# Patient Record
Sex: Female | Born: 1955 | Race: White | Hispanic: No | Marital: Married | Smoking: Never smoker
Health system: Southern US, Community
[De-identification: ages and names within clinical notes are randomized; demographics above are authoritative.]

## PROBLEM LIST (undated history)

## (undated) DIAGNOSIS — F32A Depression, unspecified: Secondary | ICD-10-CM

## (undated) DIAGNOSIS — G4730 Sleep apnea, unspecified: Secondary | ICD-10-CM

## (undated) DIAGNOSIS — M1990 Unspecified osteoarthritis, unspecified site: Secondary | ICD-10-CM

## (undated) DIAGNOSIS — E785 Hyperlipidemia, unspecified: Secondary | ICD-10-CM

## (undated) DIAGNOSIS — F419 Anxiety disorder, unspecified: Secondary | ICD-10-CM

## (undated) HISTORY — DX: Anxiety disorder, unspecified: F41.9

## (undated) HISTORY — DX: Sleep apnea, unspecified: G47.30

## (undated) HISTORY — DX: Hyperlipidemia, unspecified: E78.5

## (undated) HISTORY — DX: Depression, unspecified: F32.A

## (undated) HISTORY — PX: TONSILLECTOMY: SUR1361

## (undated) HISTORY — PX: TUBAL LIGATION: SHX77

## (undated) HISTORY — PX: JOINT REPLACEMENT: SHX530

## (undated) HISTORY — DX: Unspecified osteoarthritis, unspecified site: M19.90

---

## 2012-12-24 HISTORY — PX: ROTATOR CUFF REPAIR: SHX139

## 2014-12-24 HISTORY — PX: OTHER SURGICAL HISTORY: SHX169

## 2018-09-12 ENCOUNTER — Other Ambulatory Visit: Payer: Self-pay | Admitting: Nurse Practitioner

## 2018-09-12 DIAGNOSIS — Z1231 Encounter for screening mammogram for malignant neoplasm of breast: Secondary | ICD-10-CM

## 2018-10-21 ENCOUNTER — Ambulatory Visit: Payer: Self-pay

## 2018-10-22 ENCOUNTER — Other Ambulatory Visit: Payer: Self-pay | Admitting: Nurse Practitioner

## 2018-10-22 DIAGNOSIS — Z13820 Encounter for screening for osteoporosis: Secondary | ICD-10-CM

## 2018-10-29 ENCOUNTER — Ambulatory Visit
Admission: RE | Admit: 2018-10-29 | Discharge: 2018-10-29 | Disposition: A | Discharge status: Home / Self Care | Payer: BC Managed Care – PPO | Source: Ambulatory Visit | Attending: Nurse Practitioner | Admitting: Nurse Practitioner

## 2018-10-29 DIAGNOSIS — Z13820 Encounter for screening for osteoporosis: Secondary | ICD-10-CM

## 2018-10-31 ENCOUNTER — Ambulatory Visit
Admission: RE | Admit: 2018-10-31 | Discharge: 2018-10-31 | Disposition: A | Discharge status: Home / Self Care | Payer: BC Managed Care – PPO | Source: Ambulatory Visit | Attending: Nurse Practitioner | Admitting: Nurse Practitioner

## 2018-10-31 DIAGNOSIS — Z1231 Encounter for screening mammogram for malignant neoplasm of breast: Secondary | ICD-10-CM

## 2018-12-03 ENCOUNTER — Other Ambulatory Visit: Payer: Self-pay | Admitting: Nurse Practitioner

## 2018-12-03 ENCOUNTER — Other Ambulatory Visit (HOSPITAL_COMMUNITY)
Admission: RE | Admit: 2018-12-03 | Discharge: 2018-12-03 | Disposition: A | Discharge status: Home / Self Care | Payer: BC Managed Care – PPO | Source: Ambulatory Visit | Attending: Nurse Practitioner | Admitting: Nurse Practitioner

## 2018-12-03 DIAGNOSIS — Z124 Encounter for screening for malignant neoplasm of cervix: Secondary | ICD-10-CM | POA: Insufficient documentation

## 2018-12-05 LAB — CYTOLOGY - PAP: Diagnosis: NEGATIVE

## 2019-11-03 ENCOUNTER — Other Ambulatory Visit: Payer: Self-pay | Admitting: Internal Medicine

## 2019-11-03 DIAGNOSIS — Z1231 Encounter for screening mammogram for malignant neoplasm of breast: Secondary | ICD-10-CM

## 2019-12-28 ENCOUNTER — Other Ambulatory Visit: Payer: Self-pay

## 2019-12-28 ENCOUNTER — Ambulatory Visit
Admission: RE | Admit: 2019-12-28 | Discharge: 2019-12-28 | Disposition: A | Discharge status: Home / Self Care | Payer: BC Managed Care – PPO | Source: Ambulatory Visit | Attending: Internal Medicine | Admitting: Internal Medicine

## 2019-12-28 DIAGNOSIS — Z1231 Encounter for screening mammogram for malignant neoplasm of breast: Secondary | ICD-10-CM

## 2020-09-08 ENCOUNTER — Other Ambulatory Visit: Payer: Self-pay | Admitting: Orthopedic Surgery

## 2020-09-08 DIAGNOSIS — M75121 Complete rotator cuff tear or rupture of right shoulder, not specified as traumatic: Secondary | ICD-10-CM

## 2020-09-08 DIAGNOSIS — M75112 Incomplete rotator cuff tear or rupture of left shoulder, not specified as traumatic: Secondary | ICD-10-CM

## 2020-09-29 ENCOUNTER — Ambulatory Visit
Admission: RE | Admit: 2020-09-29 | Discharge: 2020-09-29 | Disposition: A | Discharge status: Home / Self Care | Payer: BC Managed Care – PPO | Source: Ambulatory Visit | Attending: Orthopedic Surgery | Admitting: Orthopedic Surgery

## 2020-09-29 ENCOUNTER — Other Ambulatory Visit: Payer: Self-pay

## 2020-09-29 DIAGNOSIS — M75112 Incomplete rotator cuff tear or rupture of left shoulder, not specified as traumatic: Secondary | ICD-10-CM

## 2020-09-29 DIAGNOSIS — M75121 Complete rotator cuff tear or rupture of right shoulder, not specified as traumatic: Secondary | ICD-10-CM

## 2020-10-13 ENCOUNTER — Ambulatory Visit: Payer: BC Managed Care – PPO | Attending: Orthopedic Surgery | Admitting: Physical Therapy

## 2020-10-13 ENCOUNTER — Other Ambulatory Visit: Payer: Self-pay

## 2020-10-13 ENCOUNTER — Encounter: Payer: Self-pay | Admitting: Physical Therapy

## 2020-10-13 DIAGNOSIS — M25512 Pain in left shoulder: Secondary | ICD-10-CM | POA: Diagnosis present

## 2020-10-13 DIAGNOSIS — M25511 Pain in right shoulder: Secondary | ICD-10-CM

## 2020-10-13 DIAGNOSIS — G8929 Other chronic pain: Secondary | ICD-10-CM | POA: Insufficient documentation

## 2020-10-13 NOTE — Therapy (Signed)
[facility] Outpatient Rehabilitation [facility] [address] [place], [place], [address] Phone: [phone]   Fax:  [phone]  Physical Therapy Evaluation  Patient Details  Name: Stacey Espinoza MRN: [id number] Date of Birth: 1956/05/19 Referring Provider (PT): Julieanne Cotton, MD   Encounter Date: 10/13/2020   PT End of Session - 10/13/20 0925    Visit Number 1    Number of Visits 12    Date for PT Re-Evaluation 11/24/20    Authorization Type BCBS-recheck FOTO status at visits 6 and 10    PT Start Time 0924    PT Stop Time 1010    PT Time Calculation (min) 46 min    Activity Tolerance Patient tolerated treatment well    Behavior During Therapy [facility] for tasks assessed/performed           Past Medical History:  Diagnosis Date  . Anxiety   . Arthritis   . Depression   . Hyperlipidemia   . Sleep apnea     Past Surgical History:  Procedure Laterality Date  . CESAREAN SECTION    . JOINT REPLACEMENT     Bilateral total knee arthroplasty 2006  . ROTATOR CUFF REPAIR Right 2014  . sleeve gastrectomy  2016  . TONSILLECTOMY    . TUBAL LIGATION      There were no vitals filed for this visit.    Subjective Assessment - 10/13/20 0932    Subjective Pt. is a 64 y/o female referred to PT for c/o bilateral shoulder pain. She has previous history right shoulder rotator cuff repair with SAD, DCE in 2014 and had done well after but reports began having sharp pain in right shoulder in August of this year, no specific mechanism of injury noted. She has had ongoing chronic intermittent left shoulder pain as well. Pt. had MRI for both shoulders which revealed small partial thickness + intrasubstance rotator cuff tears, bursitis and tendinosis-see chart copy of report for details. Plan is to try conservative tx. first before considering surgical intervention.    Pertinent History surgical history for right shoulder, anxiety and depression    Limitations Lifting;House hold  activities    Diagnostic tests MRI, X-rays    Patient Stated Goals Knowing what to do and not to do with shoulders    Currently in Pain? Yes    Pain Score 2     Pain Location Shoulder    Pain Orientation Right;Left    Pain Descriptors / Indicators Dull    Pain Type Chronic pain;Acute pain   acute on right, chronic on left   Pain Onset More than a month ago    Pain Frequency Constant    Aggravating Factors  reaching, activity    Pain Relieving Factors ice, medication    Effect of Pain on Daily Activities impacts ability reaching ADLs/IADLs              [facility] PT Assessment - 10/13/20 0001      Assessment   Medical Diagnosis Bilateral shoulder bursitis, weakness, impingement    Referring Provider (PT) Julieanne Cotton, MD    Onset Date/Surgical Date 08/07/20   estimated time of onset right-sided pain   Hand Dominance Right    Prior Therapy did PT after shoulder sx. 2014      Precautions   Precautions None      Restrictions   Weight Bearing Restrictions No      Balance Screen   Has the patient fallen in the past 6 months No  Home Environment   Living Environment Private residence    Living Arrangements Spouse/significant other      Prior Function   Level of Independence Independent with basic ADLs      Cognition   Overall Cognitive Status Within Functional Limits for tasks assessed      Observation/Other Assessments   Focus on Therapeutic Outcomes (FOTO)  43% limited      Posture/Postural Control   Posture/Postural Control Postural limitations    Postural Limitations Rounded Shoulders      ROM / Strength   AROM / PROM / Strength AROM;PROM;Strength      AROM   AROM Assessment Site Shoulder;Elbow    Right/Left Shoulder Right;Left    Right Shoulder Flexion 150 Degrees    Right Shoulder ABduction 130 Degrees    Right Shoulder Internal Rotation 60 Degrees   reach to T12   Right Shoulder External Rotation 60 Degrees   reach to T2   Left Shoulder Flexion 170  Degrees    Left Shoulder ABduction 160 Degrees    Left Shoulder Internal Rotation 50 Degrees   reach to T7   Left Shoulder External Rotation 70 Degrees   reahc to T3   Right/Left Elbow --   bilat. elbow AROM WFL     PROM   PROM Assessment Site Shoulder    Right/Left Shoulder Right;Left    Right Shoulder Internal Rotation 60 Degrees    Right Shoulder External Rotation 60 Degrees    Left Shoulder Internal Rotation 50 Degrees    Left Shoulder External Rotation 70 Degrees      Strength   Strength Assessment Site Shoulder;Elbow    Right/Left Shoulder Right;Left    Right Shoulder Flexion 5/5    Right Shoulder ABduction 4+/5    Right Shoulder Internal Rotation 5/5    Right Shoulder External Rotation 4+/5    Left Shoulder Flexion 5/5    Left Shoulder ABduction 5/5    Left Shoulder Internal Rotation 5/5    Left Shoulder External Rotation 5/5    Right/Left Elbow Right;Left    Right Elbow Flexion 5/5    Right Elbow Extension 5/5    Left Elbow Flexion 5/5    Left Elbow Extension 5/5      Palpation   Palpation comment tender to palpation with trigger points bilat. infraspinatus, bilat. supraspinatus tenderness to palpation as well      Special Tests   Other special tests (+) Empty can on right, (+) painful arc on right (both (-) on left, [place] (-) bilat.                      Objective measurements completed on examination: See above findings.       [facility] Adult PT Treatment/Exercise - 10/13/20 0001      Exercises   Exercises --   HEP handout review/instruction                 PT Education - 10/13/20 1050    Education Details eval findings, symptom etiology with shoulder anatomy, HEP, POC, FOTO patient report    Person(s) Educated Patient    Methods Explanation;Demonstration;Verbal cues;Handout    Comprehension Returned demonstration;Verbalized understanding               PT Long Term Goals - 10/13/20 1200      PT LONG TERM GOAL #1   Title  Independent with HEP    Baseline needs HEP    Time 6  Period Weeks    Status New    Target Date 11/24/20      PT LONG TERM GOAL #2   Title Improve FOTO outcome measure score to 33% or less limitation due to shoulder pain    Baseline 43% limited    Time 6    Period Weeks    Status New    Target Date 11/24/20      PT LONG TERM GOAL #3   Title Increase right shoulder IR reach to T9 or greater to improve ability to get dressed/donn bra    Baseline reach to T12    Time 6    Period Weeks    Status New    Target Date 11/24/20      PT LONG TERM GOAL #4   Title Increase right shoulder strength for abduction and ER to improve ability for lifting for chores    Baseline 4+/5    Time 6    Period Weeks    Status New    Target Date 11/24/20      PT LONG TERM GOAL #5   Title Perform reaching ADLs with bilateral shoulder pain decreased at least 50% or greater from baseline status    Time 6    Period Weeks    Status New    Target Date 11/24/20                  Plan - 10/13/20 1050    Clinical Impression Statement Pt. presents with bilateral shoulder pain with right>left sided weakness, painful abduction motion consistent with underlying impingement with contributing underying acromial morphology on left (history acromioplasty on right). Pt. would benefit from PT to help address bilateral shoulder pain and associated functional limitations for reaching and lifting activities.    Personal Factors and Comorbidities Comorbidity 2;Time since onset of injury/illness/exacerbation    Comorbidities see PMH, time since onset for left sided symptoms as well as surgical history on right    Examination-Activity Limitations Lift;Bathing;Dressing;Carry;Reach Overhead    Examination-Participation Restrictions Cleaning    Stability/Clinical Decision Making Evolving/Moderate complexity    Clinical Decision Making Moderate    Rehab Potential Good    PT Frequency 2x / week    PT Duration 6  weeks    PT Treatment/Interventions ADLs/Self Care Home Management;Cryotherapy;Electrical Stimulation;Iontophoresis 4mg /ml Dexamethasone;Therapeutic exercise;Patient/family education;Ultrasound;Manual techniques;Dry needling;Passive range of motion;Taping;Therapeutic activities;Neuromuscular re-education;Moist Heat    PT Next Visit Plan Review HEP as needed, focus rotator cuff and periscapular strengthening, shoulder strengthening in pain free ranges, prn manual, if needed potential consideration dry needling but plan exercise focus first    PT Home Exercise Plan Access code: [email or web address]    Consulted and Agree with Plan of Care Patient           Patient will benefit from skilled therapeutic intervention in order to improve the following deficits and impairments:  Pain, Postural dysfunction, Impaired UE functional use, Decreased strength, Decreased range of motion  Visit Diagnosis: Acute pain of right shoulder  Chronic left shoulder pain     Problem List There are no problems to display for this patient.   , PT, DPT 10/13/20 12:11 PM  [facility] Out[facility] Rehabilitation [facility] [address] [place], [place], [place] Phone: [phone]   Fax:  [phone]  Name: Stacey Espinoza MRN: Shari Heritage Date of Birth: 1956/12/11

## 2020-10-20 ENCOUNTER — Other Ambulatory Visit: Payer: Self-pay

## 2020-10-20 ENCOUNTER — Ambulatory Visit: Payer: BC Managed Care – PPO | Admitting: Physical Therapy

## 2020-10-20 ENCOUNTER — Encounter: Payer: Self-pay | Admitting: Physical Therapy

## 2020-10-20 DIAGNOSIS — M25511 Pain in right shoulder: Secondary | ICD-10-CM | POA: Diagnosis not present

## 2020-10-20 DIAGNOSIS — G8929 Other chronic pain: Secondary | ICD-10-CM

## 2020-10-20 NOTE — Therapy (Signed)
[facility] Outpatient Rehabilitation [facility] [address] [place], [place], [address] Phone: [phone]   Fax:  [phone]  Physical Therapy Treatment  Patient Details  Name: Stacey Espinoza MRN: [id number] Date of Birth: Apr 19, 1956 Referring Provider (PT): Julieanne Cotton, MD   Encounter Date: 10/20/2020   PT End of Session - 10/20/20 0944    Visit Number 2    Number of Visits 12    Date for PT Re-Evaluation 11/24/20    Authorization Type BCBS-recheck FOTO status at visits 6 and 10    PT Start Time 0930    PT Stop Time 1020    PT Time Calculation (min) 50 min           Past Medical History:  Diagnosis Date  . Anxiety   . Arthritis   . Depression   . Hyperlipidemia   . Sleep apnea     Past Surgical History:  Procedure Laterality Date  . CESAREAN SECTION    . JOINT REPLACEMENT     Bilateral total knee arthroplasty 2006  . ROTATOR CUFF REPAIR Right 2014  . sleeve gastrectomy  2016  . TONSILLECTOMY    . TUBAL LIGATION      There were no vitals filed for this visit.   Subjective Assessment - 10/20/20 0934    Subjective I have done the exercises. I have random severe pains. I had pain wtih just putting my pills in my mouth.    Currently in Pain? Yes    Pain Score 2     Pain Location Shoulder    Pain Orientation Right;Left    Pain Descriptors / Indicators Dull    Pain Type --   acute on chronic   Aggravating Factors  reaching, activity    Pain Relieving Factors ice meds, rolling onto shoulder that hurts helps                             [facility] Adult PT Treatment/Exercise - 10/20/20 0001      Shoulder Exercises: Supine   Protraction Limitations 30 reps, red     Horizontal ABduction Limitations 30 reps, red       Shoulder Exercises: Sidelying   External Rotation Weight (lbs) 1    External Rotation Limitations 15 reps x 2 each       Shoulder Exercises: Standing   External Rotation Limitations 30 reps , red bilat      Internal Rotation Limitations 30 reps red , RT, LT     Extension Limitations 30 reps , green     Row Limitations 30 reps , green     Other Standing Exercises seated cross body stretch 2 x 30 sec                   PT Education - 10/20/20 1014    Education Details HEP    Person(s) Educated Patient    Methods Explanation;Handout    Comprehension Verbalized understanding               PT Long Term Goals - 10/13/20 1200      PT LONG TERM GOAL #1   Title Independent with HEP    Baseline needs HEP    Time 6    Period Weeks    Status New    Target Date 11/24/20      PT LONG TERM GOAL #2   Title Improve FOTO outcome measure score to 33% or less  limitation due to shoulder pain    Baseline 43% limited    Time 6    Period Weeks    Status New    Target Date 11/24/20      PT LONG TERM GOAL #3   Title Increase right shoulder IR reach to T9 or greater to improve ability to get dressed/donn bra    Baseline reach to T12    Time 6    Period Weeks    Status New    Target Date 11/24/20      PT LONG TERM GOAL #4   Title Increase right shoulder strength for abduction and ER to improve ability for lifting for chores    Baseline 4+/5    Time 6    Period Weeks    Status New    Target Date 11/24/20      PT LONG TERM GOAL #5   Title Perform reaching ADLs with bilateral shoulder pain decreased at least 50% or greater from baseline status    Time 6    Period Weeks    Status New    Target Date 11/24/20                 Plan - 10/20/20 1035    Clinical Impression Statement Pt reports compliance with HEP. She reports fear with most daily activites and thinks she is further damaging her shoulders. Reviewed HEP and progressed with  sidelying ER strength and cross body stretching. Updated HEP. Pt reports fatigue with therex but no pain. Trail of ice packs at end of session to bilateral shoulders. Pt is interested in purchasing larger ice packs for home use.    PT Next Visit  Plan Review HEP as needed, focus rotator cuff and periscapular strengthening, shoulder strengthening in pain free ranges, prn manual, if needed potential consideration dry needling but plan exercise focus first    PT Home Exercise Plan Access code: [email or web address] added side ER 1# and crossbody stretch           Patient will benefit from skilled therapeutic intervention in order to improve the following deficits and impairments:     Visit Diagnosis: Acute pain of right shoulder  Chronic left shoulder pain     Problem List There are no problems to display for this patient.   Sherrie Mustache, [place] 10/20/2020, 10:39 AM  [facility] [address] [place], [place], [address] Phone: [phone]   Fax:  [phone]  Name: Stacey Espinoza MRN: [id number] Date of Birth: 1956-03-07

## 2020-10-20 NOTE — Patient Instructions (Signed)
Access Code: [email or web address] URL: [email or web address] Date: 10/20/2020 Prepared by: Jannette Spanner  Exercises added  Sidelying Shoulder ER with Towel and Dumbbell - 1 x daily - 7 x weekly - 3 sets - 10 reps Standing Shoulder Posterior Capsule Stretch - 1 x daily - 7 x weekly - 1 sets - 2-3 reps - 20-30 hold

## 2020-10-25 ENCOUNTER — Ambulatory Visit: Payer: BC Managed Care – PPO | Attending: Orthopedic Surgery | Admitting: Physical Therapy

## 2020-10-25 ENCOUNTER — Other Ambulatory Visit: Payer: Self-pay

## 2020-10-25 ENCOUNTER — Encounter: Payer: Self-pay | Admitting: Physical Therapy

## 2020-10-25 DIAGNOSIS — M25511 Pain in right shoulder: Secondary | ICD-10-CM | POA: Diagnosis present

## 2020-10-25 DIAGNOSIS — M25512 Pain in left shoulder: Secondary | ICD-10-CM | POA: Diagnosis present

## 2020-10-25 DIAGNOSIS — G8929 Other chronic pain: Secondary | ICD-10-CM | POA: Diagnosis present

## 2020-10-25 NOTE — Therapy (Signed)
[facility] Outpatient Rehabilitation [facility] [address] [place], [place], [address] Phone: [phone]   Fax:  [phone]  Physical Therapy Treatment  Patient Details  Name: Stacey Espinoza MRN: [id number] Date of Birth: 07-Jun-1956 Referring Provider (PT): Julieanne Cotton, MD   Encounter Date: 10/25/2020   PT End of Session - 10/25/20 1045    Visit Number 3    Number of Visits 12    Date for PT Re-Evaluation 11/24/20    Authorization Type BCBS-recheck FOTO status at visits 6 and 10    PT Start Time 1015    PT Stop Time 1056    PT Time Calculation (min) 41 min    Activity Tolerance Patient tolerated treatment well    Behavior During Therapy [facility] for tasks assessed/performed           Past Medical History:  Diagnosis Date  . Anxiety   . Arthritis   . Depression   . Hyperlipidemia   . Sleep apnea     Past Surgical History:  Procedure Laterality Date  . CESAREAN SECTION    . JOINT REPLACEMENT     Bilateral total knee arthroplasty 2006  . ROTATOR CUFF REPAIR Right 2014  . sleeve gastrectomy  2016  . TONSILLECTOMY    . TUBAL LIGATION      There were no vitals filed for this visit.   Subjective Assessment - 10/25/20 1035    Subjective Continued with pain last week with associated frustration with continued symptoms but doing a little better this week. Modifying activity to minimize exacerbating motions and continues with good HEP compliance.    Pertinent History surgical history for right shoulder, anxiety and depression    Currently in Pain? Yes    Pain Score --   1-2/10   Pain Location Shoulder    Pain Orientation Right;Left    Pain Descriptors / Indicators Dull    Pain Type --   acute on chronic   Pain Onset More than a month ago    Pain Frequency Constant    Aggravating Factors  reaching, activity    Pain Relieving Factors ice, medication    Effect of Pain on Daily Activities impacts ability for reaching ADLs                              [facility] Adult PT Treatment/Exercise - 10/25/20 0001      Shoulder Exercises: Supine   Protraction AROM;Strengthening;Both;20 reps    Protraction Weight (lbs) serratus punch 2 lbs. ea. UE bilat. 2x10    Other Supine Exercises supien rhythmic stabilization at 90 deg flexion 3 x20 sec ea. bilat.      Shoulder Exercises: Sidelying   External Rotation Weight (lbs) 1    External Rotation Limitations 2x10 ea. bilat. eccentric emphasis    ABduction Limitations scaotion x 15 reps each bilat.      Shoulder Exercises: Standing   Protraction Limitations wall "push up with a plus" 2x10    External Rotation Limitations ER isometric 5 sec x 10 ea. bilat-demo as alternative to Theraband exercise for HEP if too sore at times to perform    Internal Rotation AROM;Strengthening;Both;20 reps    Theraband Level (Shoulder Internal Rotation) Level 3 (Green)    Row AROM;Strengthening;Both;20 reps    Theraband Level (Shoulder Row) Level 4 (Blue)    Other Standing Exercises small ball "circles" at wall CW/CCW x 15 ea. way bilat.  Manual Therapy   Manual Therapy Soft tissue mobilization    Soft tissue mobilization STM/trigger point release bilat. posterior rotator cuff/periscaoular region                  PT Education - 10/25/20 1057    Education Details plane of motion for reaching into scaption rather than abduction    Person(s) Educated Patient    Methods Explanation;Demonstration    Comprehension Verbalized understanding               PT Long Term Goals - 10/13/20 1200      PT LONG TERM GOAL #1   Title Independent with HEP    Baseline needs HEP    Time 6    Period Weeks    Status New    Target Date 11/24/20      PT LONG TERM GOAL #2   Title Improve FOTO outcome measure score to 33% or less limitation due to shoulder pain    Baseline 43% limited    Time 6    Period Weeks    Status New    Target Date 11/24/20      PT LONG TERM GOAL #3   Title Increase right  shoulder IR reach to T9 or greater to improve ability to get dressed/donn bra    Baseline reach to T12    Time 6    Period Weeks    Status New    Target Date 11/24/20      PT LONG TERM GOAL #4   Title Increase right shoulder strength for abduction and ER to improve ability for lifting for chores    Baseline 4+/5    Time 6    Period Weeks    Status New    Target Date 11/24/20      PT LONG TERM GOAL #5   Title Perform reaching ADLs with bilateral shoulder pain decreased at least 50% or greater from baseline status    Time 6    Period Weeks    Status New    Target Date 11/24/20                 Plan - 10/25/20 1047    Clinical Impression Statement Mild improvement noted now with tx. to date  with tx. and HEP to date. Given symptom etiology gradual progress expected so would consider as good progress/tx. response to date. Plan continue PT for further progress to help decrease pain and address associated functional limitations for reaching ADLs.    Personal Factors and Comorbidities Comorbidity 2;Time since onset of injury/illness/exacerbation    Examination-Activity Limitations Lift;Bathing;Dressing;Carry;Reach Overhead    Examination-Participation Restrictions Cleaning    Stability/Clinical Decision Making Evolving/Moderate complexity    Clinical Decision Making Moderate    Rehab Potential Good    PT Frequency 2x / week    PT Duration 6 weeks    PT Treatment/Interventions ADLs/Self Care Home Management;Cryotherapy;Electrical Stimulation;Iontophoresis 4mg /ml Dexamethasone;Therapeutic exercise;Patient/family education;Ultrasound;Manual techniques;Dry needling;Passive range of motion;Taping;Therapeutic activities;Neuromuscular re-education;Moist Heat    PT Next Visit Plan focus rotator cuff and periscapular strengthening, shoulder strengthening in pain free ranges, prn manual, if needed potential consideration dry needling but plan exercise focus first    PT Home Exercise Plan  Access code: [email or web address] added side ER 1# and crossbody stretch    Consulted and Agree with Plan of Care Patient           Patient will benefit from skilled therapeutic intervention in order to improve the following deficits  and impairments:  Pain, Postural dysfunction, Impaired UE functional use, Decreased strength, Decreased range of motion  Visit Diagnosis: Acute pain of right shoulder  Chronic left shoulder pain     Problem List There are no problems to display for this patient.   Lazarus Gowda, PT, DPT 10/25/20 10:59 AM  [facility] [address] [place], [place], [address] Phone: [phone]   Fax:  [phone]  Name: Stacey Espinoza MRN: [id number] Date of Birth: 04/08/1956

## 2020-10-27 ENCOUNTER — Ambulatory Visit: Payer: BC Managed Care – PPO | Admitting: Physical Therapy

## 2020-11-01 ENCOUNTER — Ambulatory Visit: Payer: BC Managed Care – PPO | Admitting: Physical Therapy

## 2020-11-01 ENCOUNTER — Other Ambulatory Visit: Payer: Self-pay

## 2020-11-01 ENCOUNTER — Encounter: Payer: Self-pay | Admitting: Physical Therapy

## 2020-11-01 DIAGNOSIS — M25512 Pain in left shoulder: Secondary | ICD-10-CM

## 2020-11-01 DIAGNOSIS — M25511 Pain in right shoulder: Secondary | ICD-10-CM | POA: Diagnosis not present

## 2020-11-01 DIAGNOSIS — G8929 Other chronic pain: Secondary | ICD-10-CM

## 2020-11-01 NOTE — Patient Instructions (Signed)

## 2020-11-01 NOTE — Therapy (Signed)
[facility] [address] [place], [place], [address] Phone: [phone]   Fax:  [phone]  Physical Therapy Evaluation  Patient Details  Name: Stacey Espinoza MRN: [id number] Date of Birth: 12/15/1956 Referring Provider (PT): Julieanne Cotton, MD   Encounter Date: 11/01/2020   PT End of Session - 11/01/20 0927    Visit Number 4    Number of Visits 12    Date for PT Re-Evaluation 11/24/20    Authorization Type BCBS-recheck FOTO status at visits 6 and 10    PT Start Time 0924    PT Stop Time 1012    PT Time Calculation (min) 48 min    Activity Tolerance Patient tolerated treatment well    Behavior During Therapy [facility] for tasks assessed/performed           Past Medical History:  Diagnosis Date  . Anxiety   . Arthritis   . Depression   . Hyperlipidemia   . Sleep apnea     Past Surgical History:  Procedure Laterality Date  . CESAREAN SECTION    . JOINT REPLACEMENT     Bilateral total knee arthroplasty 2006  . ROTATOR CUFF REPAIR Right 2014  . sleeve gastrectomy  2016  . TONSILLECTOMY    . TUBAL LIGATION      There were no vitals filed for this visit.    Subjective Assessment - 11/01/20 0952    Subjective Improvement with shoulder in terms of GH joint pain-no pain on left and mild symptoms on right but today primary complaint today is right upper trapezius region pain with onset about 2 days ago-no mechanism of injury noted and difficulty noted with turning head to right.    Currently in Pain? Yes    Pain Score 3     Pain Location Neck    Pain Orientation Right    Pain Descriptors / Indicators Burning    Pain Type Acute pain    Pain Radiating Towards right upper trapezius region    Pain Onset In the past 7 days    Pain Frequency Constant    Aggravating Factors  turning head to the side    Effect of Pain on Daily Activities increased difficulty driving                          Objective  measurements completed on examination: See above findings.       [facility] Adult PT Treatment/Exercise - 11/01/20 0001      Exercises   Exercises Neck      Shoulder Exercises: Standing   External Rotation AROM;Strengthening;Right;Left;20 reps    External Rotation Limitations red band righ side, green band left side    Internal Rotation AROM;Strengthening;Both;20 reps    Internal Rotation Limitations green band right side, blue band left side    Flexion AROM;Strengthening;Both;20 reps    Shoulder Flexion Weight (lbs) 1    ABduction AROM;Strengthening;Right;Left;20 reps    ABduction Limitations scaption 2x10 ea. bilat. with 1 lb. weight on left, no weight on right    Extension Limitations Freemotion shoulder extension 3 lbs. ea. bilat. 2x10    Row Limitations Freemotion cable row 3 lbs. 2x10 ea. bilat.    Other Standing Exercises body blade ER/IR 20 sec x 3 each bilat.      Manual Therapy   Manual Therapy Manual Traction    Soft tissue mobilization right upper trapezius regiobn and levator trigger point STM    Manual  Traction gentle cervical manual traction      Neck Exercises: Stretches   Upper Trapezius Stretch Right;3 reps;30 seconds    Levator Stretch Right;3 reps;30 seconds            Trigger Point Dry Needling - 11/01/20 0001    Consent Given? Yes    Education Handout Provided Yes    Muscles Treated Head and Neck Upper trapezius    Dry Needling Comments right upper trapezius in left sidelying with 30-32 gauge 30 mm needles                PT Education - 11/01/20 1009    Education Details dry needling, trigger point etiology, HEP updates    Person(s) Educated Patient    Methods Explanation;Demonstration;Handout    Comprehension Verbalized understanding;Returned demonstration               PT Long Term Goals - 10/13/20 1200      PT LONG TERM GOAL #1   Title Independent with HEP    Baseline needs HEP    Time 6    Period Weeks    Status New    Target  Date 11/24/20      PT LONG TERM GOAL #2   Title Improve FOTO outcome measure score to 33% or less limitation due to shoulder pain    Baseline 43% limited    Time 6    Period Weeks    Status New    Target Date 11/24/20      PT LONG TERM GOAL #3   Title Increase right shoulder IR reach to T9 or greater to improve ability to get dressed/donn bra    Baseline reach to T12    Time 6    Period Weeks    Status New    Target Date 11/24/20      PT LONG TERM GOAL #4   Title Increase right shoulder strength for abduction and ER to improve ability for lifting for chores    Baseline 4+/5    Time 6    Period Weeks    Status New    Target Date 11/24/20      PT LONG TERM GOAL #5   Title Perform reaching ADLs with bilateral shoulder pain decreased at least 50% or greater from baseline status    Time 6    Period Weeks    Status New    Target Date 11/24/20                  Plan - 11/01/20 1010    Clinical Impression Statement Included trial dry needling today to address right upper trapezius region pain consistent with myofascial etiology-would suspect associated with compensatory use of upper trapezius for right shoulder ROM vs. postural association. Otherwise progressing well from baseline status with decreased shoulder pain.    Personal Factors and Comorbidities Comorbidity 2;Time since onset of injury/illness/exacerbation    Comorbidities see PMH, time since onset for left sided symptoms as well as surgical history on right    Examination-Activity Limitations Lift;Bathing;Dressing;Carry;Reach Overhead    Examination-Participation Restrictions Cleaning    Stability/Clinical Decision Making Evolving/Moderate complexity    Clinical Decision Making Moderate    Rehab Potential Good    PT Frequency 2x / week    PT Duration 6 weeks    PT Treatment/Interventions ADLs/Self Care Home Management;Cryotherapy;Electrical Stimulation;Iontophoresis 4mg /ml Dexamethasone;Therapeutic  exercise;Patient/family education;Ultrasound;Manual techniques;Dry needling;Passive range of motion;Taping;Therapeutic activities;Neuromuscular re-education;Moist Heat    PT Next Visit Plan Check response  dry needling and continue as found beneficial, continue focus rotator cuff and periscapular strengthening, shoulder strengthening in pain free ranges, prn manual, if needed potential consideration dry needling but plan exercise focus first    PT Home Exercise Plan Access code: [email or web address] added side ER 1# and crossbody stretch    Consulted and Agree with Plan of Care Patient           Patient will benefit from skilled therapeutic intervention in order to improve the following deficits and impairments:  Pain, Postural dysfunction, Impaired UE functional use, Decreased strength, Decreased range of motion  Visit Diagnosis: Acute pain of right shoulder  Chronic left shoulder pain     Problem List There are no problems to display for this patient.  Lazarus Gowda, PT, DPT 11/01/20 10:29 AM  [facility] [address] [place], [place], [address] Phone: [phone]   Fax:  [phone]  Name: Stacey Espinoza MRN: [id number] Date of Birth: Jan 11, 1956

## 2020-11-03 ENCOUNTER — Encounter: Payer: BC Managed Care – PPO | Admitting: Physical Therapy

## 2020-11-08 ENCOUNTER — Other Ambulatory Visit: Payer: Self-pay

## 2020-11-08 ENCOUNTER — Ambulatory Visit: Payer: BC Managed Care – PPO | Admitting: Physical Therapy

## 2020-11-08 ENCOUNTER — Encounter: Payer: Self-pay | Admitting: Physical Therapy

## 2020-11-08 DIAGNOSIS — G8929 Other chronic pain: Secondary | ICD-10-CM

## 2020-11-08 DIAGNOSIS — M25511 Pain in right shoulder: Secondary | ICD-10-CM | POA: Diagnosis not present

## 2020-11-08 NOTE — Therapy (Signed)
[facility] [address] [place], [place], [address] Phone: [phone]   Fax:  [phone]  Physical Therapy Treatment  Patient Details  Name: Stacey Espinoza MRN: [id number] Date of Birth: 11/05/1956 Referring Provider (PT): Julieanne Cotton, MD   Encounter Date: 11/08/2020   PT End of Session - 11/08/20 0937    Visit Number 5    Number of Visits 12    Date for PT Re-Evaluation 11/24/20    Authorization Type BCBS-recheck FOTO status at visits 6 and 10    PT Start Time 0924    PT Stop Time 1010    PT Time Calculation (min) 46 min    Activity Tolerance Patient tolerated treatment well    Behavior During Therapy [facility] for tasks assessed/performed           Past Medical History:  Diagnosis Date  . Anxiety   . Arthritis   . Depression   . Hyperlipidemia   . Sleep apnea     Past Surgical History:  Procedure Laterality Date  . CESAREAN SECTION    . JOINT REPLACEMENT     Bilateral total knee arthroplasty 2006  . ROTATOR CUFF REPAIR Right 2014  . sleeve gastrectomy  2016  . TONSILLECTOMY    . TUBAL LIGATION      There were no vitals filed for this visit.   Subjective Assessment - 11/08/20 0927    Subjective Pt. reports initially no benefit from dry needling the day after session but has since noted resolution of upper trapezius region pain. Left shoulder doing well. Right shoulder feels "tired" but pain has been minimal and reports sleeping better now.    Currently in Pain? Yes    Pain Score 1     Pain Location Shoulder    Pain Orientation Right    Pain Descriptors / Indicators --   "tired"   Pain Type Acute pain    Pain Onset More than a month ago    Pain Frequency Intermittent    Aggravating Factors  reaching activities    Pain Relieving Factors ice, medication                             [facility] Adult PT Treatment/Exercise - 11/08/20 0001      Exercises   Exercises Shoulder      Shoulder  Exercises: Standing   Extension Limitations Freemotion shoulder extension 3 lbs. ea. bilat. 2x10    Row Limitations Freemotion cable row 3 lbs. 2x10 ea. bilat.    Diagonals Limitations D1 2x10 ea. bilat. with 2 lbs., D2 2x10 ea. bilat. no weight on right    Other Standing Exercises "W" ER row red band 2x10, wall "push up wth a plus" 2x10, small ball "alphabet" at wall x 1 ea. bilat.    Other Standing Exercises body blade ER 30 sec x 2 ea. bilat.      Shoulder Exercises: ROM/Strengthening   Nustep L5 x 5 min UE/LE      Manual Therapy   Manual Traction STM right upper trapezius and posterior scapular region                  PT Education - 11/08/20 1011    Education Details HEP, POC    Person(s) Educated Patient    Methods Explanation    Comprehension Verbalized understanding               PT Long Term  Goals - 10/13/20 1200      PT LONG TERM GOAL #1   Title Independent with HEP    Baseline needs HEP    Time 6    Period Weeks    Status New    Target Date 11/24/20      PT LONG TERM GOAL #2   Title Improve FOTO outcome measure score to 33% or less limitation due to shoulder pain    Baseline 43% limited    Time 6    Period Weeks    Status New    Target Date 11/24/20      PT LONG TERM GOAL #3   Title Increase right shoulder IR reach to T9 or greater to improve ability to get dressed/donn bra    Baseline reach to T12    Time 6    Period Weeks    Status New    Target Date 11/24/20      PT LONG TERM GOAL #4   Title Increase right shoulder strength for abduction and ER to improve ability for lifting for chores    Baseline 4+/5    Time 6    Period Weeks    Status New    Target Date 11/24/20      PT LONG TERM GOAL #5   Title Perform reaching ADLs with bilateral shoulder pain decreased at least 50% or greater from baseline status    Time 6    Period Weeks    Status New    Target Date 11/24/20                 Plan - 11/08/20 3704    Clinical  Impression Statement Good response to dry needling last week to ease right upper trapezius pain symptoms and good progress overall with improving bilat. shoulder pain symptoms and functional gains for reaching ADLs and decreased sleep disturbance. Focus exercise progression today but still some tissue tension in right upper trapezius and posterior scapular region so included STM to address. Given improvenment and in discussing status with pt. plan decrease freqeuncy to 1x/week and continue for 2 more weeks and potential d/c by then if improvement continues.    Personal Factors and Comorbidities Comorbidity 2;Time since onset of injury/illness/exacerbation    Comorbidities see PMH, time since onset for left sided symptoms as well as surgical history on right    Examination-Activity Limitations Lift;Bathing;Dressing;Carry;Reach Overhead    Examination-Participation Restrictions Cleaning    Stability/Clinical Decision Making Evolving/Moderate complexity    Clinical Decision Making Moderate    Rehab Potential Good    PT Frequency --   1-2x/week   PT Duration 6 weeks    PT Treatment/Interventions ADLs/Self Care Home Management;Cryotherapy;Electrical Stimulation;Iontophoresis 4mg /ml Dexamethasone;Therapeutic exercise;Patient/family education;Ultrasound;Manual techniques;Dry needling;Passive range of motion;Taping;Therapeutic activities;Neuromuscular re-education;Moist Heat    PT Next Visit Plan Recheck FOTO next session, Decrease to 1x/week and continue x 2 more weeks, focus exercise progression for shoulder strengthening, further dry needling/manual if needed    PT Home Exercise Plan Access code: [email or web address] added side ER 1# and crossbody stretch    Consulted and Agree with Plan of Care Patient           Patient will benefit from skilled therapeutic intervention in order to improve the following deficits and impairments:  Pain, Postural dysfunction, Impaired UE functional use, Decreased strength,  Decreased range of motion  Visit Diagnosis: Acute pain of right shoulder  Chronic left shoulder pain     Problem List There are no problems to  display for this patient.   Lazarus Gowda, PT, DPT 11/08/20 10:13 AM  [facility] [address] [place], [place], [address] Phone: [phone]   Fax:  [phone]  Name: Sadia Belfiore MRN: [id number] Date of Birth: Jan 26, 1956

## 2020-11-10 ENCOUNTER — Encounter: Payer: BC Managed Care – PPO | Admitting: Physical Therapy

## 2020-11-15 ENCOUNTER — Ambulatory Visit: Payer: BC Managed Care – PPO | Admitting: Physical Therapy

## 2020-11-15 ENCOUNTER — Other Ambulatory Visit: Payer: Self-pay

## 2020-11-15 ENCOUNTER — Encounter: Payer: Self-pay | Admitting: Physical Therapy

## 2020-11-15 DIAGNOSIS — G8929 Other chronic pain: Secondary | ICD-10-CM

## 2020-11-15 DIAGNOSIS — M25511 Pain in right shoulder: Secondary | ICD-10-CM

## 2020-11-15 NOTE — Therapy (Signed)
[place], [place], [address] Phone: [phone]   Fax:  [phone]  Physical Therapy Treatment  Patient Details  Name: Stacey Espinoza MRN: [id number] Date of Birth: 03-29-1956 Referring Provider (PT): Lita Mains, MD   Encounter Date: 11/15/2020   PT End of Session - 11/15/20 0925    Visit Number 6    Number of Visits 12    Date for PT Re-Evaluation 11/24/20    Authorization Type BCBS-recheck FOTO status at visits 6 and 10    PT Start Time 0928    PT Stop Time 1015   6 min spent dry needling not included in direct timed minutes   PT Time Calculation (min) 47 min    Activity Tolerance --   brief pain with dry needling and attempted horizontal abduction exercise otherwise session well-tolerated   Behavior During Therapy [facility] for tasks assessed/performed           Past Medical History:  Diagnosis Date  . Anxiety   . Arthritis   . Depression   . Hyperlipidemia   . Sleep apnea     Past Surgical History:  Procedure Laterality Date  . CESAREAN SECTION    . JOINT REPLACEMENT     Bilateral total knee arthroplasty 2006  . ROTATOR CUFF REPAIR Right 2014  . sleeve gastrectomy  2016  . TONSILLECTOMY    . TUBAL LIGATION      There were no vitals filed for this visit.   Subjective Assessment - 11/15/20 0933    Subjective Pt. reports symptoms were exacerbated a couple of days after last session with increased pain reaching and waking up a couple times with shoulder pain. Pain intermittently sharp and rated 3/10 this AM in right shoulder and 1/10 left shoulder. No further symptoms in right upper trapezius region.              [facility] PT Assessment - 11/15/20 0001      Observation/Other Assessments   Focus on Therapeutic Outcomes (FOTO)  40% limited                         [facility] Adult PT Treatment/Exercise - 11/15/20 0001      Shoulder Exercises: Supine   Horizontal ABduction --    attmpted with red band but held due to right shoulder pain   Theraband Level (Shoulder Horizontal ABduction) --    External Rotation AROM;Strengthening;Both;20 reps    Theraband Level (Shoulder External Rotation) Level 2 (Red)    Internal Rotation AROM;Strengthening;Right;20 reps      Shoulder Exercises: Sidelying   External Rotation AROM;Strengthening;Right;20 reps    External Rotation Weight (lbs) 1    ABduction Limitations scaption 1 lb. 2x10 right side      Shoulder Exercises: Standing   Protraction AROM;Strengthening;Both;20 reps    Protraction Limitations closed chain protraction at wall    Extension Limitations Freemotion shoulder extension 3 lbs. ea. bilat. 2x10    Row Limitations Freemotion cable row 3 lbs. 2x10 ea. bilat.    Other Standing Exercises bicep curl 3 lbs. 2x10 ea. bilat.    Other Standing Exercises body blad ER/IR at 0 deg abd 20 sec x 3 ea. bilat.      Shoulder Exercises: ROM/Strengthening   Nustep L4 x 5 min UE/LE      Manual Therapy   Manual Therapy Joint mobilization    Joint Mobilization Right shoulder AP GH mobs grade I-II  Soft tissue mobilization right posterior shoulder and anterior deltoid, proximal bicep region                  PT Education - 11/15/20 1017    Education Details symptom etiology, POC, HEP-perform exercises 3x/week and avoid any significant pain/hold any specific exercises that are too painful    Person(s) Educated Patient    Methods Explanation    Comprehension Verbalized understanding               PT Long Term Goals - 11/15/20 1027      PT LONG TERM GOAL #1   Title Independent with HEP    Baseline met for initial HEP, updates ongoing    Time 6    Period Weeks    Status Achieved      PT LONG TERM GOAL #2   Title Improve FOTO outcome measure score to 33% or less limitation due to shoulder pain    Baseline 40% limited 11/15/20    Time 6    Period Weeks    Status On-going      PT LONG TERM GOAL #3    Title Increase right shoulder IR reach to T9 or greater to improve ability to get dressed/donn bra    Baseline reach to T12    Time 6    Period Weeks    Status On-going      PT LONG TERM GOAL #4   Title Increase right shoulder strength for abduction and ER to 5/5 to improve ability for lifting for chores    Baseline 4+/5    Time 6    Period Weeks    Status On-going      PT LONG TERM GOAL #5   Title Perform reaching ADLs with bilateral shoulder pain decreased at least 50% or greater from baseline status    Baseline ongoing    Time 6    Period Weeks    Status On-going                 Plan - 11/15/20 1020    Clinical Impression Statement Pt. previously progressing well but some setback this past week with right>left shoulder pain exacerbation as noted in subjective. Primary discomfort today in anterior shoulder region rather than subacromial region with local tenderness in anterior deltoid and bicep. Pt. did have previous biceps tenotomy so biceps tendinopathy unlikely. Would suspect pain from continued impingement vs. deltoid bursitis or myofascial pain. Pt. also does have significant underlying OA which could be contributing factor as well. Backed down on exercise progression today and discussed moderation with HEP/avoiding excess pain and will await further status by next session next week.    Personal Factors and Comorbidities Comorbidity 2;Time since onset of injury/illness/exacerbation    Comorbidities see PMH, time since onset for left sided symptoms as well as surgical history on right    Examination-Activity Limitations Lift;Bathing;Dressing;Carry;Reach Overhead    Examination-Participation Restrictions Cleaning    Stability/Clinical Decision Making Evolving/Moderate complexity    Clinical Decision Making Moderate    Rehab Potential Good    PT Frequency --   1-2x/week   PT Duration 6 weeks    PT Treatment/Interventions ADLs/Self Care Home  Management;Cryotherapy;Electrical Stimulation;Iontophoresis 59m/ml Dexamethasone;Therapeutic exercise;Patient/family education;Ultrasound;Manual techniques;Dry needling;Passive range of motion;Taping;Therapeutic activities;Neuromuscular re-education;Moist Heat    PT Next Visit Plan Check status next week and recert vs. d/c, continue shoulder exercise progression with rotator cuff strengthening and scapular stabilization as tolerated, update HEP as needed if d/c  PT Home Exercise Plan Access code: [email or web address] added side ER 1# and crossbody stretch    Consulted and Agree with Plan of Care Patient           Patient will benefit from skilled therapeutic intervention in order to improve the following deficits and impairments:  Pain, Postural dysfunction, Impaired UE functional use, Decreased strength, Decreased range of motion  Visit Diagnosis: Acute pain of right shoulder  Chronic left shoulder pain     Problem List There are no problems to display for this patient.   Beaulah Dinning, PT, DPT 11/15/20 10:28 AM  [facility] [address] [place], [place], [address] Phone: [phone]   Fax:  [phone]  Name: Shauntae Reitman MRN: [id number] Date of Birth: 1955/12/30

## 2020-11-22 ENCOUNTER — Ambulatory Visit: Payer: BC Managed Care – PPO | Admitting: Physical Therapy

## 2020-11-22 ENCOUNTER — Other Ambulatory Visit: Payer: Self-pay

## 2020-11-22 ENCOUNTER — Encounter: Payer: Self-pay | Admitting: Physical Therapy

## 2020-11-22 DIAGNOSIS — M25511 Pain in right shoulder: Secondary | ICD-10-CM

## 2020-11-22 DIAGNOSIS — M25512 Pain in left shoulder: Secondary | ICD-10-CM

## 2020-11-22 DIAGNOSIS — G8929 Other chronic pain: Secondary | ICD-10-CM

## 2020-11-22 NOTE — Therapy (Signed)
[place], [place], [address] Phone: [phone]   Fax:  [phone]  Physical Therapy Treatment  Patient Details  Name: Stacey Espinoza MRN: [id number] Date of Birth: December 11, 1956 Referring Provider (PT): Lita Mains, MD   Encounter Date: 11/22/2020   PT End of Session - 11/22/20 0926    Visit Number 7    Number of Visits 12    Date for PT Re-Evaluation 11/24/20    Authorization Type [employer]-recheck FOTO status at visits 6 and 10    PT Start Time 0924    PT Stop Time 1007    PT Time Calculation (min) 43 min    Activity Tolerance Patient tolerated treatment well    Behavior During Therapy [facility] for tasks assessed/performed           Past Medical History:  Diagnosis Date  . Anxiety   . Arthritis   . Depression   . Hyperlipidemia   . Sleep apnea     Past Surgical History:  Procedure Laterality Date  . CESAREAN SECTION    . JOINT REPLACEMENT     Bilateral total knee arthroplasty 2006  . ROTATOR CUFF REPAIR Right 2014  . sleeve gastrectomy  2016  . TONSILLECTOMY    . TUBAL LIGATION      There were no vitals filed for this visit.   Subjective Assessment - 11/22/20 0925    Subjective Doing a little better than last week. 1/10 left shoulder pain and 2/10 right shoulder pain this AM. Overall pt. rates improvement at around 60% from baseline since starting therapy. Discussed status and plan will be for patient to try continuing via HEP for the next few weeks and follow up only if needed. See assessment/plan.              [facility] PT Assessment - 11/22/20 0001      AROM   Right Shoulder Flexion 160 Degrees    Right Shoulder ABduction 160 Degrees    Right Shoulder Internal Rotation --   reach to T9   Right Shoulder External Rotation --   reach to T2   Left Shoulder Flexion 170 Degrees    Left Shoulder ABduction 180 Degrees    Left Shoulder Internal Rotation --   reach to T7   Left Shoulder  External Rotation --   reach to T2     Strength   Right Shoulder Flexion 5/5    Right Shoulder ABduction 5/5    Right Shoulder Internal Rotation 5/5    Right Shoulder External Rotation 5/5    Left Shoulder Flexion 5/5    Left Shoulder ABduction 5/5    Left Shoulder Internal Rotation 5/5    Left Shoulder External Rotation 5/5    Right Elbow Flexion 5/5    Right Elbow Extension 5/5    Left Elbow Flexion 5/5    Left Elbow Extension 5/5                         [facility] Adult PT Treatment/Exercise - 11/22/20 0001      Shoulder Exercises: Supine   Protraction AROM;Strengthening;Both;20 reps    Protraction Weight (lbs) 2    Protraction Limitations serratus punch    Horizontal ABduction AROM;Strengthening;Both;20 reps    Theraband Level (Shoulder Horizontal ABduction) Level 3 (Green)    Horizontal ABduction Limitations also performed isometric with towel x 10 reps and shoulder bilat. flexion with towel horizontal abduction isometric x 10  reps      Shoulder Exercises: Standing   External Rotation Limitations "W" ER row red band bilat. UE 2x10    Internal Rotation AROM;Strengthening;Right;Left;20 reps    Theraband Level (Shoulder Internal Rotation) Level 3 (Green)    Flexion Limitations 2x10 ea. bilat. to 90 deg 1 lb. righ, 2 lbs. left    ABduction Limitations scaption 2x10 ea. bilat. 1 lb. right, 2 lbs. left    Extension AROM;Strengthening;Both;20 reps    Theraband Level (Shoulder Extension) Level 4 (Blue)    Row AROM;Strengthening;Both;20 reps    Theraband Level (Shoulder Row) Level 4 (Blue)    Other Standing Exercises lift/move crate from chair back and forth to counter-8 lb. box with 10 lb. weight added 2 sets of 5    Other Standing Exercises wall push up bilat. UE x 10 reps and 1x10 ea. bilat. unilat. wall push up      Shoulder Exercises: ROM/Strengthening   Nustep L5 x 5 min UE/LE                       PT Long Term Goals - 11/22/20 0948      PT LONG  TERM GOAL #1   Title Independent with HEP    Baseline met    Time 6    Period Weeks    Status Achieved      PT LONG TERM GOAL #2   Title Improve FOTO outcome measure score to 33% or less limitation due to shoulder pain    Baseline 40% limited 11/15/20    Time 6    Period Weeks    Status On-going      PT LONG TERM GOAL #3   Title Increase right shoulder IR reach to T9 or greater to improve ability to get dressed/donn bra    Baseline met    Time 6    Period Weeks    Status Achieved      PT LONG TERM GOAL #4   Title Increase right shoulder strength for abduction and ER to 5/5 to improve ability for lifting for chores    Baseline 5/5    Time 6    Period Weeks    Status Achieved      PT LONG TERM GOAL #5   Title Perform reaching ADLs with bilateral shoulder pain decreased at least 50% or greater from baseline status    Baseline 60% improvement per subjective report    Time 6    Period Weeks    Status Achieved                 Plan - 11/22/20 0955    Clinical Impression Statement Pt. has attended 7 therapy visits with moderate improvement in shoulder pain as noted in subjective and objecitve goals for shoulder ROM and strength met. At this point patient is independent with HEP so plan have her continue on her own with HEP for the next few weeks to see how things go and also follow up with MD as scheduled. Depending on status would plan d/c vs. return for further therapy only if needed.    Personal Factors and Comorbidities Comorbidity 2;Time since onset of injury/illness/exacerbation    Comorbidities see PMH, time since onset for left sided symptoms as well as surgical history on right    Examination-Activity Limitations Lift;Bathing;Dressing;Carry;Reach Overhead    Examination-Participation Restrictions Cleaning    Stability/Clinical Decision Making Evolving/Moderate complexity    Clinical Decision Making Moderate  PT Frequency --   1-2x/week   PT Duration 6 weeks     PT Treatment/Interventions ADLs/Self Care Home Management;Cryotherapy;Electrical Stimulation;Iontophoresis 29m/ml Dexamethasone;Therapeutic exercise;Patient/family education;Ultrasound;Manual techniques;Dry needling;Passive range of motion;Taping;Therapeutic activities;Neuromuscular re-education;Moist Heat    PT Next Visit Plan continue with HEP and return only if needed    PT Home Exercise Plan Access code: [email or web address]    Consulted and Agree with Plan of Care Patient           Patient will benefit from skilled therapeutic intervention in order to improve the following deficits and impairments:  Pain, Postural dysfunction, Impaired UE functional use, Decreased strength, Decreased range of motion  Visit Diagnosis: Acute pain of right shoulder  Chronic left shoulder pain     Problem List There are no problems to display for this patient.   CBeaulah Dinning PT, DPT 11/22/20 10:09 AM  [facility]GRuss[address] [address]Phone: [phone]  Fax:  [phone] Name: ELylia KarnMRN: [id number] of Birth: 1Sep 20, 1957

## 2020-11-23 ENCOUNTER — Other Ambulatory Visit: Payer: Self-pay | Admitting: Internal Medicine

## 2020-11-23 DIAGNOSIS — Z1231 Encounter for screening mammogram for malignant neoplasm of breast: Secondary | ICD-10-CM

## 2020-11-24 ENCOUNTER — Encounter: Payer: BC Managed Care – PPO | Admitting: Physical Therapy

## 2020-12-13 NOTE — Therapy (Signed)
[place], [place], [address] Phone: [phone]   Fax:  [phone]  Physical Therapy Treatment/Discharge  Patient Details  Name: Stacey Espinoza MRN: [id number] Date of Birth: Aug 12, 1956 Referring Provider (PT): Lita Mains, MD   Encounter Date: 11/22/2020    Past Medical History:  Diagnosis Date  . Anxiety   . Arthritis   . Depression   . Hyperlipidemia   . Sleep apnea     Past Surgical History:  Procedure Laterality Date  . CESAREAN SECTION    . JOINT REPLACEMENT     Bilateral total knee arthroplasty 2006  . ROTATOR CUFF REPAIR Right 2014  . sleeve gastrectomy  2016  . TONSILLECTOMY    . TUBAL LIGATION      There were no vitals filed for this visit.                                   PT Long Term Goals - 11/22/20 0948      PT LONG TERM GOAL #1   Title Independent with HEP    Baseline met    Time 6    Period Weeks    Status Achieved      PT LONG TERM GOAL #2   Title Improve FOTO outcome measure score to 33% or less limitation due to shoulder pain    Baseline 40% limited 11/15/20    Time 6    Period Weeks    Status On-going      PT LONG TERM GOAL #3   Title Increase right shoulder IR reach to T9 or greater to improve ability to get dressed/donn bra    Baseline met    Time 6    Period Weeks    Status Achieved      PT LONG TERM GOAL #4   Title Increase right shoulder strength for abduction and ER to 5/5 to improve ability for lifting for chores    Baseline 5/5    Time 6    Period Weeks    Status Achieved      PT LONG TERM GOAL #5   Title Perform reaching ADLs with bilateral shoulder pain decreased at least 50% or greater from baseline status    Baseline 60% improvement per subjective report    Time 6    Period Weeks    Status Achieved                  Patient will benefit from skilled therapeutic intervention in order to improve  the following deficits and impairments:  Pain,Postural dysfunction,Impaired UE functional use,Decreased strength,Decreased range of motion  Visit Diagnosis: Acute pain of right shoulder  Chronic left shoulder pain     Problem List There are no problems to display for this patient.      PHYSICAL THERAPY DISCHARGE SUMMARY  Visits from Start of Care: 7  Current functional level related to goals / functional outcomes: Patient was last seen for therapy 11/22/20 and at the time noted subjective improvement of 60% from baseline status and had met objective goals excepting FOTO score improvement. Plan had been to try continuing via HEP and follow up with MD as planned for visit 12/08/20. No further therapy visits scheduled/planned at this time so recommend follow up as needed with MD for any further shoulder issues or treatment options.   Remaining deficits: Shoulder pain   Education / Equipment: HEP,  issued Theraband red and green Plan: Patient agrees to discharge.  Patient goals were partially met. Patient is being discharged due to meeting the stated rehab goals.  ?????           Beaulah Dinning, PT, DPT 12/13/20 5:03 PM        [facility] [address] [place], [place], [address] Phone: [phone]   Fax:  [phone]  Name: Stacey Espinoza MRN: [id number] Date of Birth: 03/17/1956

## 2021-01-11 ENCOUNTER — Ambulatory Visit: Payer: BC Managed Care – PPO

## 2021-01-27 ENCOUNTER — Ambulatory Visit: Payer: BC Managed Care – PPO

## 2021-03-08 ENCOUNTER — Encounter: Payer: BC Managed Care – PPO | Attending: Internal Medicine | Admitting: Dietician

## 2021-03-08 ENCOUNTER — Other Ambulatory Visit: Payer: Self-pay

## 2021-03-08 ENCOUNTER — Encounter: Payer: Self-pay | Admitting: Dietician

## 2021-03-08 VITALS — Ht 61.0 in | Wt 206.6 lb

## 2021-03-08 DIAGNOSIS — E669 Obesity, unspecified: Secondary | ICD-10-CM | POA: Insufficient documentation

## 2021-03-08 NOTE — Patient Instructions (Addendum)
Restart taking your bariatric multivitamin and calcium supplement as directed.  Begin to identify undesirable food choices, and then work to identify places that you can intervene along the ACTION CHAIN to change the trajectory of your food choices.  Choose plant based oils/spreads in place of butter.  Have 5-6 small meals a day. Eat slowly and chew your food to applesauce consistency, put your utensil down between bites, alternate foods at each bite.  Begin to add in healthy carbs (whole grains, whole fruits) and pair them with protein.

## 2021-03-08 NOTE — Progress Notes (Signed)
Medical Nutrition Therapy  Appointment Start time:  0930  Appointment End time:  1030  Primary concerns today: Weight Loss  Referral diagnosis: E66.01 Obesity Preferred learning style: No preference indicated Learning readiness: Ready    NUTRITION ASSESSMENT   Anthropometrics  Ht: 5'1" Wt: 206.6 lbs Wt history: 266 lbs before sleeve 2016, 175 at their lowest in 2018 Body mass index is 39.04 kg/m.   Clinical Medical Hx: Sleeve gastrectomy, hyperlipidemia Medications: Atorvastatin, citalopram  Labs: N/A Notable Signs/Symptoms: Joint inflammation  Lifestyle & Dietary Hx Pt reports inflammation in their shoulders and was recommended a "keto" or low sugar, low refined flour diet.  Pt reports not liking to cook and prefers convenient foods. May cook 2 meals a week. Pt reports eating small meals every few hours is their preferred dietary pattern. Pt reports poor control over eating too much if they open a package of food like crackers or cookies. Pt reports a long history of different diets. Pt states they would restrict themselves significantly and regain the weight plus more. Pt reports decision making is psychologically draining, and makes dietary decisions difficult. Pt states they do not sense fullness properly and sometimes overeats to the point of vomiting. Pt reports eating quickly due to being very hungry. Pt reports taking care of their grandchildren during the week from 8am - 5 pm, and this makes consistent meals difficult at times. Pt reports getting too busy taking care of grandchildren or doing activities with them and forgets to eat.  Pt reports enjoying being out in the yard when the weather is nice and enjoys walking to relax.  Estimated daily fluid intake: 48 oz Supplements: Bariatric MV, Bariatric Calcium, ashwagandha, turmeric, biotin, green tea Sleep: Well, 6.5 - 7 hours Stress / self-care: Some depression and anxiety, takes ashwaghandha and uses relaxation from  reading, music, walks Current average weekly physical activity: Yoga once a week, [facility]  24-Hr Dietary Recall Unable to collect   NUTRITION DIAGNOSIS  NB-1.1 Food and nutrition-related knowledge deficit As related to obesity.  As evidenced by BMI of 39.04 kg/m2, long history of restrictive dieting, desire to be in ketosis, and sleeve gastrectomy in 2016.   NUTRITION INTERVENTION  Nutrition education (E-1) on the following topics:  Educated patient on the balanced plate eating model. Recommended lunch and dinner be 1/2 non-starchy vegetables, 1/4 starches, and 1/4 protein. Recommended breakfast be a balance of starch and protein with a piece of fruit. Discussed with patient the importance of working towards hitting the proportions of the balanced plate consistently. Counseled patient on ways to begin recognizing each of the food groups from the balanced plate in their own meals, and how close they are to fitting the recommended proportions of the balanced plate. Educated patient on the nutritional value of each food group on the balanced plate model. Counseled patient on beginning to rebuild their trust in themselves to make the right food choices for their health. Educated patient on mindful eating, including listening to their body's hunger and satiety cues, as well as eating slowly and allowing meals to be more of a sensory experience. Counseled patient on allowing themselves to be present in their emotions when they consider emotional eating. Advised patient to evaluate whether the impulse to eat is hunger based, or emotionally driven. . Educated patient on the anti-inflammatory benefits of the Mediterranean Diet.  Educated patient on the benefit of continuing their bariatric supplement regiment compared to standard women's MV. Educated patient on the ketogenic diet and  its drawbacks related to bariatric nutrition, and their history of restrictive dieting.  Handouts Provided Include    General, Healthful Mediterranean Diet Nutrition Care Manual  Balanced Snacks  Learning Style & Readiness for Change Teaching method utilized: Visual & Auditory  Demonstrated degree of understanding via: Teach Back  Barriers to learning/adherence to lifestyle change: none  Goals Established by Pt  Restart taking your bariatric multivitamin and calcium supplement as directed.  Begin to identify undesirable food choices, and then work to identify places that you can intervene along the ACTION CHAIN to change the trajectory of your food choices.  Choose plant based oils/spreads in place of butter.  Have 5-6 small meals a day. Eat slowly and chew your food to applesauce consistency, put your utensil down between bites, alternate foods at each bite.  Begin to add in healthy carbs (whole grains, whole fruits) and pair them with protein.   MONITORING & EVALUATION Dietary intake, weekly physical activity, and carb intake in 1 month.  Next Steps  Patient is to restart bariatric MV and Calcium, and follow up with RDN.

## 2021-03-15 ENCOUNTER — Other Ambulatory Visit: Payer: Self-pay

## 2021-03-15 ENCOUNTER — Ambulatory Visit
Admission: RE | Admit: 2021-03-15 | Discharge: 2021-03-15 | Disposition: A | Discharge status: Home / Self Care | Payer: BC Managed Care – PPO | Source: Ambulatory Visit | Attending: Internal Medicine | Admitting: Internal Medicine

## 2021-03-15 DIAGNOSIS — Z1231 Encounter for screening mammogram for malignant neoplasm of breast: Secondary | ICD-10-CM

## 2021-04-04 ENCOUNTER — Ambulatory Visit: Payer: BC Managed Care – PPO | Admitting: Dietician

## 2021-05-16 ENCOUNTER — Ambulatory Visit: Payer: BC Managed Care – PPO | Admitting: Dietician

## 2022-02-27 ENCOUNTER — Other Ambulatory Visit: Payer: Self-pay | Admitting: Internal Medicine

## 2022-02-27 DIAGNOSIS — Z1231 Encounter for screening mammogram for malignant neoplasm of breast: Secondary | ICD-10-CM

## 2022-03-02 ENCOUNTER — Other Ambulatory Visit: Payer: Self-pay | Admitting: Internal Medicine

## 2022-03-02 DIAGNOSIS — R519 Headache, unspecified: Secondary | ICD-10-CM

## 2022-03-16 ENCOUNTER — Ambulatory Visit
Admission: RE | Admit: 2022-03-16 | Discharge: 2022-03-16 | Disposition: A | Discharge status: Home / Self Care | Payer: BC Managed Care – PPO | Source: Ambulatory Visit | Attending: Internal Medicine | Admitting: Internal Medicine

## 2022-03-16 DIAGNOSIS — Z1231 Encounter for screening mammogram for malignant neoplasm of breast: Secondary | ICD-10-CM

## 2022-03-27 ENCOUNTER — Other Ambulatory Visit: Payer: BC Managed Care – PPO

## 2022-07-18 ENCOUNTER — Emergency Department (HOSPITAL_COMMUNITY)
Admission: EM | Admit: 2022-07-18 | Discharge: 2022-07-18 | Disposition: A | Discharge status: Home / Self Care | Payer: BC Managed Care – PPO | Attending: Emergency Medicine | Admitting: Emergency Medicine

## 2022-07-18 ENCOUNTER — Encounter (HOSPITAL_COMMUNITY): Payer: Self-pay

## 2022-07-18 ENCOUNTER — Emergency Department (HOSPITAL_COMMUNITY): Payer: BC Managed Care – PPO

## 2022-07-18 DIAGNOSIS — R072 Precordial pain: Secondary | ICD-10-CM | POA: Diagnosis not present

## 2022-07-18 DIAGNOSIS — R079 Chest pain, unspecified: Secondary | ICD-10-CM | POA: Diagnosis present

## 2022-07-18 LAB — BASIC METABOLIC PANEL
Anion gap: 10 (ref 5–15)
BUN: 10 mg/dL (ref 8–23)
CO2: 28 mmol/L (ref 22–32)
Calcium: 9.3 mg/dL (ref 8.9–10.3)
Chloride: 103 mmol/L (ref 98–111)
Creatinine, Ser: 0.67 mg/dL (ref 0.44–1.00)
GFR, Estimated: >60 mL/min (ref >60)
Glucose, Bld: 100 mg/dL — ABNORMAL HIGH (ref 70–99)
Potassium: 3.7 mmol/L (ref 3.5–5.1)
Sodium: 141 mmol/L (ref 135–145)

## 2022-07-18 LAB — CBC
HCT: 43.2 % (ref 36.0–46.0)
Hemoglobin: 14.6 g/dL (ref 12.0–15.0)
MCH: 30.8 pg (ref 26.0–34.0)
MCHC: 33.8 g/dL (ref 30.0–36.0)
MCV: 91.1 fL (ref 80.0–100.0)
Platelets: 191 10*3/uL (ref 150–400)
RBC: 4.74 MIL/uL (ref 3.87–5.11)
RDW: 13.7 % (ref 11.5–15.5)
WBC: 4.1 10*3/uL (ref 4.0–10.5)
nRBC: 0 % (ref 0.0–0.2)

## 2022-07-18 LAB — TROPONIN I (HIGH SENSITIVITY)
Troponin I (High Sensitivity): 4 ng/L (ref <18)
Troponin I (High Sensitivity): 4 ng/L (ref <18)

## 2022-07-18 LAB — D-DIMER, QUANTITATIVE: D-Dimer, Quant: 0.35 ug/mL-FEU (ref 0.00–0.50)

## 2022-07-18 NOTE — ED Notes (Signed)
AVS provided to and discussed with patient and family member at bedside. Pt verbalizes understanding of discharge instructions and denies any questions or concerns at this time. Pt has ride home. Pt ambulated out of department independently with steady gait.  

## 2022-07-18 NOTE — ED Provider Notes (Signed)
Patient's care assumed at 3:30 PM from [facility] PA-C patient is pending results of a be met and troponin.  Laboratory evaluations returned to be met shows no significant abnormality troponin is negative x2.  I reevaluated patient she is pain-free.  Patient wants to go home.  I discussed pain with patient I have advised her to follow-up with cardiology for further evaluation.   Fransico Meadow, [place] 07/18/22 Virl Cagey    Gareth Morgan, MD 07/19/22 [phone]

## 2022-07-18 NOTE — ED Provider Notes (Signed)
[facility] EMERGENCY DEPARTMENT Provider Note   CSN: [id number] Arrival date & time: 07/18/22  1138     History  Chief Complaint  Patient presents with   Chest Pain    Stacey Espinoza is a 66 y.o. female.  Patient with history of high cholesterol presents from her primary care office today for evaluation of chest pain.  Patient developed an episode of central chest pain without radiation while sitting in her chair at home this morning.  Pain was a pressure/burning sensation in the mid chest.  No associated shortness of breath or lightheadedness.  Symptoms lasted 10 to 15 minutes before resolving.  Family member called EMS and she refused transport as pain had resolved prior to their evaluation.  She did call her primary care doctor and was seen there today.  While in the office, she had recurrent pain, treated with nitroglycerin, and transported to the emergency department for cardiac evaluation. Patient denies risk factors for pulmonary embolism including: unilateral leg swelling, history of DVT/PE/other blood clots, use of exogenous hormones, recent immobilizations, recent surgery, recent travel (>4hr segment), malignancy, hemoptysis.         Home Medications Prior to Admission medications   Medication Sig Start Date End Date Taking? Authorizing Provider  atorvastatin (LIPITOR) 20 MG tablet Take 20 mg by mouth daily.    [provider]  citalopram (CELEXA) 20 MG tablet Take 20 mg by mouth daily.    [provider]  ezetimibe (ZETIA) 10 MG tablet Take 10 mg by mouth daily.    [provider]  predniSONE (STERAPRED UNI-PAK 21 TAB) 10 MG (21) TBPK tablet Take 10 mg by mouth daily. Patient not taking: Reported on 11/08/2020    [provider]      Allergies    Codeine and Oxycodone    Review of Systems   Review of Systems  Physical Exam Updated Vital Signs BP 129/89   Pulse 88   Temp 98.4 F (36.9 C) (Oral)   Resp 16    Ht 5\' 1"  (1.549 m)   Wt 98.9 kg   SpO2 92%   BMI 41.19 kg/m   Physical Exam Vitals and nursing note reviewed.  Constitutional:      Appearance: She is well-developed. She is not diaphoretic.  HENT:     Head: Normocephalic and atraumatic.     Mouth/Throat:     Mouth: Mucous membranes are not dry.  Eyes:     Conjunctiva/sclera: Conjunctivae normal.  Neck:     Vascular: Normal carotid pulses. No JVD.     Trachea: Trachea normal. No tracheal deviation.  Cardiovascular:     Rate and Rhythm: Normal rate and regular rhythm.     Pulses: No decreased pulses.          Radial pulses are 2+ on the right side and 2+ on the left side.     Heart sounds: Normal heart sounds, S1 normal and S2 normal. No murmur heard. Pulmonary:     Effort: Pulmonary effort is normal. No respiratory distress.     Breath sounds: No wheezing.  Chest:     Chest wall: No tenderness.  Abdominal:     General: Bowel sounds are normal.     Palpations: Abdomen is soft.     Tenderness: There is no abdominal tenderness. There is no guarding or rebound.  Musculoskeletal:        General: Normal range of motion.     Cervical back: Normal range  of motion and neck supple. No muscular tenderness.     Right lower leg: No tenderness. No edema.     Left lower leg: No tenderness. No edema.  Skin:    General: Skin is warm and dry.     Coloration: Skin is not pale.  Neurological:     Mental Status: She is alert.    ED Results / Procedures / Treatments   Labs (all labs ordered are listed, but only abnormal results are displayed) Labs Reviewed  BASIC METABOLIC PANEL  CBC  D-DIMER, QUANTITATIVE  BASIC METABOLIC PANEL  TROPONIN I (HIGH SENSITIVITY)  TROPONIN I (HIGH SENSITIVITY)    EKG EKG Interpretation  Date/Time:  Wednesday July 18 2022 11:47:01 EDT Ventricular Rate:  100 PR Interval:  146 QRS Duration: 86 QT Interval:  366 QTC Calculation: 473 R Axis:   -44 Text Interpretation: Sinus tachycardia Left  anterior fascicular block Low voltage, precordial leads Confirmed by Gloris Manchester [phone]) on 07/18/2022 12:16:49 PM  Radiology DG Chest 2 View  Result Date: 07/18/2022 CLINICAL DATA:  Chest pain/pressure.  Nonsmoker. EXAM: CHEST - 2 VIEW COMPARISON:  None Available. FINDINGS: The heart size and mediastinal contours are normal. The lungs are clear. There is no pleural effusion or pneumothorax. No acute osseous findings are identified. There are paraspinal osteophytes throughout the thoracic spine. Telemetry leads overlie the chest. IMPRESSION: No evidence of active cardiopulmonary process. Electronically Signed   By: Carey Bullocks M.D.   On: 07/18/2022 12:35    Procedures Procedures    Medications Ordered in ED Medications - No data to display  ED Course/ Medical Decision Making/ A&P    Patient seen and examined. History obtained directly from patient.   Labs/EKG: Ordered CBC, BMP, troponin, D-dimer.  EKG personally reviewed and interpreted, agree no ischemic findings.  Imaging: Chest x-ray personally reviewed and interpreted, agree negative.  Medications/Fluids: None ordered.  Patient declines medication for pain at this time.  Most recent vital signs reviewed and are as follows: BP 129/89   Pulse 88   Temp 98.4 F (36.9 C) (Oral)   Resp 16   Ht 5\' 1"  (1.549 m)   Wt 98.9 kg   SpO2 92%   BMI 41.19 kg/m   Initial impression: Atypical chest pain.  1:45 PM Reassessment performed. Patient appears comfortable.  She reports recurrent mid chest pressure.  Repeat EKG unchanged.  Offered pain medication, patient declines.  Labs personally reviewed and interpreted including: D-dimer normal, first troponin normal, CBC unremarkable.  Pending BMP.  Will await recheck second troponin.  Plan: BMP, second troponin.  3:24 PM Signout to [place] PA-C at shift change.   Awaiting BMP and second troponin.  If negative, patient will need reassessment and can likely go home.  Consider Pepcid for  possible GERD symptoms/esophageal spasm.                             Medical Decision Making Amount and/or Complexity of Data Reviewed Labs: ordered. Radiology: ordered.   For this patient's complaint of chest pain, the following emergent conditions were considered on the differential diagnosis: acute coronary syndrome, pulmonary embolism, pneumothorax, myocarditis, pericardial tamponade, aortic dissection, thoracic aortic aneurysm complication, esophageal perforation.   Other causes were also considered including: gastroesophageal reflux disease, esophageal spasm, musculoskeletal pain including costochondritis, pneumonia/pleurisy, herpes zoster, pericarditis.  In regards to possibility of ACS, patient has atypical features of pain, non-ischemic and unchanged EKG and negative troponin(s). Heart score  was calculated to be 3.   In regards to possibility of PE, patient low risk Wells and D-dimer normal.  The patient's vital signs, pertinent lab work and imaging were reviewed and interpreted as discussed in the ED course. Hospitalization was considered for further testing, treatments, or serial exams/observation. However as patient is well-appearing, has a stable exam, and reassuring studies today, I do not feel that they warrant admission at this time. This plan was discussed with the patient who verbalizes agreement and comfort with this plan and seems reliable and able to return to the Emergency Department with worsening or changing symptoms.         Final Clinical Impression(s) / ED Diagnoses Final diagnoses:  Precordial pain    Rx / DC Orders ED Discharge Orders     None         Renne Crigler, PA-C 07/18/22 1526    Gloris Manchester, MD 07/23/22 [phone]

## 2022-07-18 NOTE — ED Notes (Signed)
Lab called to add on D-dimer to previous sent labs.

## 2022-07-18 NOTE — ED Notes (Signed)
Pt transported to Xray. 

## 2022-07-18 NOTE — ED Triage Notes (Signed)
Pt BIB [facility] from PCP C/O CP starting today. Pt went to PCP who sent her here for further evaluation. Pt took 324 aspirin and 1 SL Nitro at PCP office.

## 2022-07-18 NOTE — ED Notes (Signed)
Lab tech to recollect BMP sent at 1423. Previous collection @ 1200 also hemolyzed.

## 2022-07-18 NOTE — Discharge Instructions (Signed)
Please read and follow all provided instructions.  Your diagnoses today include:  1. Precordial pain     Tests performed today include: An EKG of your heart A chest x-ray Cardiac enzymes - a blood test for heart muscle damage Blood counts and electrolytes Vital signs. See below for your results today.   Medications prescribed:  Pepcid (famotidine) - antihistamine  You can find this medication over-the-counter.   DO NOT exceed:  20mg  Pepcid every 12 hours  Take any prescribed medications only as directed.  Follow-up instructions: Please follow-up with your primary care provider as soon as you can for further evaluation of your symptoms.   Return instructions:  SEEK IMMEDIATE MEDICAL ATTENTION IF: You have severe chest pain, especially if the pain is crushing or pressure-like and spreads to the arms, back, neck, or jaw, or if you have sweating, nausea or vomiting, or trouble with breathing. THIS IS AN EMERGENCY. Do not wait to see if the pain will go away. Get medical help at once. Call 911. DO NOT drive yourself to the hospital.  Your chest pain gets worse and does not go away after a few minutes of rest.  You have an attack of chest pain lasting longer than what you usually experience.  You have significant dizziness, if you pass out, or have trouble walking.  You have chest pain not typical of your usual pain for which you originally saw your caregiver.  You have any other emergent concerns regarding your health.  Additional Information: Chest pain comes from many different causes. Your caregiver has diagnosed you as having chest pain that is not specific for one problem, but does not require admission.  You are at low risk for an acute heart condition or other serious illness.   Your vital signs today were: BP 133/88   Pulse 85   Temp 98.4 F (36.9 C) (Oral)   Resp 13   Ht 5\' 1"  (1.549 m)   Wt 98.9 kg   SpO2 92%   BMI 41.19 kg/m  If your blood pressure (BP) was  elevated above 135/85 this visit, please have this repeated by your doctor within one month. --------------

## 2022-08-03 ENCOUNTER — Ambulatory Visit: Payer: BC Managed Care – PPO | Admitting: Internal Medicine

## 2022-08-03 ENCOUNTER — Encounter: Payer: Self-pay | Admitting: Internal Medicine

## 2022-08-03 VITALS — BP 150/93 | HR 88 | Temp 97.8°F | Resp 16 | Ht 61.0 in | Wt 219.0 lb

## 2022-08-03 DIAGNOSIS — R002 Palpitations: Secondary | ICD-10-CM | POA: Insufficient documentation

## 2022-08-03 DIAGNOSIS — I10 Essential (primary) hypertension: Secondary | ICD-10-CM

## 2022-08-03 DIAGNOSIS — E785 Hyperlipidemia, unspecified: Secondary | ICD-10-CM

## 2022-08-03 DIAGNOSIS — R079 Chest pain, unspecified: Secondary | ICD-10-CM

## 2022-08-03 MED ORDER — METOPROLOL SUCCINATE ER 25 MG PO TB24: 25.0000 mg | ORAL_TABLET | Freq: Every day | ORAL | 2 refills | Status: DC

## 2022-08-03 MED ORDER — AMLODIPINE BESYLATE 2.5 MG PO TABS: 2.5000 mg | ORAL_TABLET | Freq: Every day | ORAL | 2 refills | Status: DC

## 2022-08-03 NOTE — Patient Instructions (Addendum)
Continue current cardiac medications. Sending 2.5mg  amlodipine and 25mg  toprolXL. Encourage low-sodium diet, less than 2000 mg daily. Schedule imaging tests in office - echo and stress test. Follow-up in 1-2 months or sooner if needed.

## 2022-08-03 NOTE — Progress Notes (Signed)
Primary Physician/Referring:  Renford Dills, MD  Patient ID: Stacey Espinoza, female    DOB: September 30, 1956, 66 y.o.   MRN: [id number]  No chief complaint on file.  HPI:    Stacey Espinoza  is a 66 y.o. female with past medical history significant for sleep apnea, hypertension, and hyperlipidemia who is here to establish care.  Patient was referred to [place] due to chest pain and palpitations.  Patient states this can come on during exercise or rest she has not really noticed a pattern.  However, patient is worried since it has happened a couple of times now.  She has not had an echo or stress test in the past.  Patient is not on any blood pressure pills but is willing to start.  Today she denies chest pain, shortness of breath, palpitations, diaphoresis, syncope, PND, orthopnea, edema.  Past Medical History:  Diagnosis Date   Anxiety    Arthritis    Depression    Hyperlipidemia    Sleep apnea    Past Surgical History:  Procedure Laterality Date   CESAREAN SECTION     JOINT REPLACEMENT     Bilateral total knee arthroplasty 2006   ROTATOR CUFF REPAIR Right 2014   sleeve gastrectomy  2016   TONSILLECTOMY     TUBAL LIGATION     Family History  Problem Relation Age of Onset   Pulmonary fibrosis Mother    Parkinson's disease Father    Hyperlipidemia Brother    Hypertension Brother    Diabetes Brother    Obesity Brother    Diabetes Maternal Aunt    Breast cancer Maternal Aunt    Hyperlipidemia Half-Brother    Diabetes Half-Brother     Social History   Tobacco Use   Smoking status: Never   Smokeless tobacco: Never  Substance Use Topics   Alcohol use: Yes    Alcohol/week: 2.0 standard drinks of alcohol    Types: 2 Glasses of wine per week    Comment: occasionally   Marital Status: Married  ROS  Review of Systems  Cardiovascular:  Positive for chest pain, irregular heartbeat and palpitations.  All other systems reviewed and are negative. Objective  Blood pressure (!)  150/93, pulse 88, temperature 97.8 F (36.6 C), temperature source Temporal, resp. rate 16, height 5\' 1"  (1.549 m), weight 219 lb (99.3 kg), SpO2 93 %. Body mass index is 41.38 kg/m.     08/03/2022    9:39 AM 07/18/2022    5:49 PM 07/18/2022    5:30 PM  Vitals with BMI  Height 5\' 1"     Weight 219 lbs    BMI 41.4    Systolic 150 131 07/20/2022  Diastolic 93 79 89  Pulse 88 81 89     Physical Exam Constitutional:      Appearance: Normal appearance. She is obese.  HENT:     Head: Normocephalic and atraumatic.  Eyes:     Extraocular Movements: Extraocular movements intact.  Neck:     Vascular: No carotid bruit.  Cardiovascular:     Rate and Rhythm: Normal rate and regular rhythm.     Pulses: Normal pulses.     Heart sounds: Normal heart sounds. No murmur heard.    No friction rub. No gallop.  Pulmonary:     Effort: Pulmonary effort is normal.     Breath sounds: Normal breath sounds.  Abdominal:     General: Bowel sounds are normal.     Palpations: Abdomen is  soft.  Musculoskeletal:     Right lower leg: No edema.     Left lower leg: No edema.  Skin:    General: Skin is warm and dry.  Neurological:     Mental Status: She is alert.   Medications and allergies   Allergies  Allergen Reactions   Codeine Nausea Only   Oxycodone Itching     Medication list after today's encounter   Current Outpatient Medications:    amLODipine (NORVASC) 2.5 MG tablet, Take 1 tablet (2.5 mg total) by mouth daily., Disp: 30 tablet, Rfl: 2   atorvastatin (LIPITOR) 20 MG tablet, Take 20 mg by mouth daily., Disp: , Rfl:    Cholecalciferol (VITAMIN D) 50 MCG (2000 UT) CAPS, Take 1 capsule by mouth daily., Disp: , Rfl:    citalopram (CELEXA) 20 MG tablet, Take 20 mg by mouth daily., Disp: , Rfl:    esomeprazole (EQ ESOMEPRAZOLE MAGNESIUM) 20 MG capsule, Take 20 mg by mouth daily at 12 noon., Disp: , Rfl:    ezetimibe (ZETIA) 10 MG tablet, Take 10 mg by mouth daily., Disp: , Rfl:    Ferrous Sulfate  (IRON) 325 (65 Fe) MG TABS, Take 1 tablet by mouth every other day., Disp: , Rfl:    metoprolol succinate (TOPROL XL) 25 MG 24 hr tablet, Take 1 tablet (25 mg total) by mouth at bedtime., Disp: 30 tablet, Rfl: 2  Laboratory examination:   Lab Results  Component Value Date   NA 141 07/18/2022   K 3.7 07/18/2022   CO2 28 07/18/2022   GLUCOSE 100 (H) 07/18/2022   BUN 10 07/18/2022   CREATININE 0.67 07/18/2022   CALCIUM 9.3 07/18/2022   GFRNONAA >60 07/18/2022       Latest Ref Rng & Units 07/18/2022    5:40 PM 07/18/2022   12:00 PM  CMP  Glucose 70 - 99 mg/dL 782    BUN 8 - 23 mg/dL 10    Creatinine 9.56 - 1.00 mg/dL 2.13    Sodium 086 - 578 mmol/L 141  SPECIMEN HEMOLYZED. HEMOLYSIS MAY AFFECT INTEGRITY OF RESULTS.   Potassium 3.5 - 5.1 mmol/L 3.7    Chloride 98 - 111 mmol/L 103    CO2 22 - 32 mmol/L 28    Calcium 8.9 - 10.3 mg/dL 9.3        Latest Ref Rng & Units 07/18/2022   12:00 PM  CBC  WBC 4.0 - 10.5 K/uL 4.1   Hemoglobin 12.0 - 15.0 g/dL 46.9   Hematocrit 62.9 - 46.0 % 43.2   Platelets 150 - 400 K/uL 191     Lipid Panel No results for input(s): "CHOL", "TRIG", "LDLCALC", "VLDL", "HDL", "CHOLHDL", "LDLDIRECT" in the last 8760 hours.  HEMOGLOBIN A1C No results found for: "HGBA1C", "MPG" TSH No results for input(s): "TSH" in the last 8760 hours.  External labs:     Radiology:    Cardiac Studies:   No results found for this or any previous visit from the past 1095 days.     No results found for this or any previous visit from the past 1095 days.     EKG:   08/03/22 EKG - normal sinus rhythm  Assessment     ICD-10-CM   1. Chest pain of uncertain etiology  R07.9 EKG 12-Lead    PCV ECHOCARDIOGRAM COMPLETE    PCV MYOCARDIAL PERFUSION WO LEXISCAN    2. Primary hypertension  I10     3. Hyperlipidemia LDL goal <70  E78.5  4. Palpitations  R00.2        Orders Placed This Encounter  Procedures   PCV MYOCARDIAL PERFUSION WO LEXISCAN     Standing Status:   Future    Standing Expiration Date:   10/03/2022   EKG 12-Lead   PCV ECHOCARDIOGRAM COMPLETE    Standing Status:   Future    Standing Expiration Date:   08/04/2023    Meds ordered this encounter  Medications   metoprolol succinate (TOPROL XL) 25 MG 24 hr tablet    Sig: Take 1 tablet (25 mg total) by mouth at bedtime.    Dispense:  30 tablet    Refill:  2   amLODipine (NORVASC) 2.5 MG tablet    Sig: Take 1 tablet (2.5 mg total) by mouth daily.    Dispense:  30 tablet    Refill:  2    Medications Discontinued During This Encounter  Medication Reason   predniSONE (STERAPRED UNI-PAK 21 TAB) 10 MG (21) TBPK tablet      Recommendations:   LAURIA DEPOY is a 66 y.o.  F with chest pain and palpitations  Continue current cardiac medications. Sending 2.5mg  amlodipine and 25mg  toprolXL. Encourage low-sodium diet, less than 2000 mg daily. Schedule imaging tests in office - echo and stress test. Follow-up in 1-2 months or sooner if needed.     , DO  08/03/2022, 2:37 PM Office: [phone] Pager: [phone]

## 2022-08-14 ENCOUNTER — Ambulatory Visit: Payer: BC Managed Care – PPO | Admitting: Cardiology

## 2022-08-22 ENCOUNTER — Ambulatory Visit: Payer: BC Managed Care – PPO

## 2022-08-22 DIAGNOSIS — R079 Chest pain, unspecified: Secondary | ICD-10-CM

## 2022-09-05 ENCOUNTER — Ambulatory Visit: Payer: BC Managed Care – PPO

## 2022-09-05 DIAGNOSIS — R079 Chest pain, unspecified: Secondary | ICD-10-CM

## 2022-09-14 ENCOUNTER — Ambulatory Visit: Payer: BC Managed Care – PPO | Admitting: Internal Medicine

## 2022-09-18 ENCOUNTER — Ambulatory Visit: Payer: BC Managed Care – PPO | Admitting: Internal Medicine

## 2022-09-18 ENCOUNTER — Encounter: Payer: Self-pay | Admitting: Internal Medicine

## 2022-09-18 VITALS — BP 124/75 | HR 68 | Temp 97.6°F | Resp 16 | Ht 61.0 in | Wt 219.0 lb

## 2022-09-18 DIAGNOSIS — I10 Essential (primary) hypertension: Secondary | ICD-10-CM

## 2022-09-18 DIAGNOSIS — R002 Palpitations: Secondary | ICD-10-CM

## 2022-09-18 DIAGNOSIS — E785 Hyperlipidemia, unspecified: Secondary | ICD-10-CM

## 2022-09-18 NOTE — Progress Notes (Signed)
Primary Physician/Referring:  Renford Dills, MD  Patient ID: Stacey Espinoza, female    DOB: 18-Oct-1956, 66 y.o.   MRN: [id number]  No chief complaint on file.  HPI:    Stacey Espinoza  is a 66 y.o. female with past medical history significant for sleep apnea, hypertension, and hyperlipidemia who is here for a follow-up visit.  She is tolerating her blood pressure medications well. Her echo and stress test were both normal. Patient believes her palpitations may be due to anxiety. Otherwise, she has been doing very well. She feels better now that her testing came back normal. She denies chest pain, shortness of breath, diaphoresis, syncope, PND, orthopnea, edema.  Past Medical History:  Diagnosis Date   Anxiety    Arthritis    Depression    Hyperlipidemia    Sleep apnea    Past Surgical History:  Procedure Laterality Date   CESAREAN SECTION     JOINT REPLACEMENT     Bilateral total knee arthroplasty 2006   ROTATOR CUFF REPAIR Right 2014   sleeve gastrectomy  2016   TONSILLECTOMY     TUBAL LIGATION     Family History  Problem Relation Age of Onset   Pulmonary fibrosis Mother    Parkinson's disease Father    Hyperlipidemia Brother    Hypertension Brother    Diabetes Brother    Obesity Brother    Diabetes Maternal Aunt    Breast cancer Maternal Aunt    Hyperlipidemia Half-Brother    Diabetes Half-Brother     Social History   Tobacco Use   Smoking status: Never   Smokeless tobacco: Never  Substance Use Topics   Alcohol use: Yes    Alcohol/week: 2.0 standard drinks of alcohol    Types: 2 Glasses of wine per week    Comment: occasionally   Marital Status: Married  ROS  Review of Systems  Cardiovascular:  Positive for palpitations. Negative for chest pain and irregular heartbeat.  All other systems reviewed and are negative.  Objective  Blood pressure 124/75, pulse 68, temperature 97.6 F (36.4 C), temperature source Temporal, resp. rate 16, height 5\' 1"  (1.549  m), weight 219 lb (99.3 kg), SpO2 96 %. Body mass index is 41.38 kg/m.     09/18/2022    9:30 AM 08/03/2022    9:39 AM 07/18/2022    5:49 PM  Vitals with BMI  Height 5\' 1"  5\' 1"    Weight 219 lbs 219 lbs   BMI 41.4 41.4   Systolic 124 150 07/20/2022  Diastolic 75 93 79  Pulse 68 88 81     Physical Exam Vitals and nursing note reviewed.  Constitutional:      Appearance: Normal appearance.  HENT:     Head: Normocephalic and atraumatic.  Neck:     Vascular: No carotid bruit.  Cardiovascular:     Rate and Rhythm: Normal rate and regular rhythm.     Pulses: Normal pulses.     Heart sounds: Normal heart sounds. No murmur heard.    No friction rub. No gallop.  Pulmonary:     Effort: Pulmonary effort is normal.     Breath sounds: Normal breath sounds.  Abdominal:     General: Bowel sounds are normal.  Musculoskeletal:     Right lower leg: No edema.     Left lower leg: No edema.  Skin:    General: Skin is warm and dry.  Neurological:     Mental Status: She is  alert.   Medications and allergies   Allergies  Allergen Reactions   Codeine Nausea Only   Oxycodone Itching     Medication list after today's encounter   Current Outpatient Medications:    amLODipine (NORVASC) 2.5 MG tablet, Take 1 tablet (2.5 mg total) by mouth daily., Disp: 30 tablet, Rfl: 2   atorvastatin (LIPITOR) 20 MG tablet, Take 20 mg by mouth daily., Disp: , Rfl:    calcium carbonate (OS-CAL) 1250 (500 Ca) MG chewable tablet, Chew 1 tablet by mouth daily., Disp: , Rfl:    Cholecalciferol (VITAMIN D) 50 MCG (2000 UT) CAPS, Take 1 capsule by mouth daily., Disp: , Rfl:    citalopram (CELEXA) 20 MG tablet, Take 20 mg by mouth daily., Disp: , Rfl:    esomeprazole (EQ ESOMEPRAZOLE MAGNESIUM) 20 MG capsule, Take 20 mg by mouth daily at 12 noon., Disp: , Rfl:    ezetimibe (ZETIA) 10 MG tablet, Take 10 mg by mouth daily., Disp: , Rfl:    Ferrous Sulfate (IRON) 325 (65 Fe) MG TABS, Take 1 tablet by mouth every other day.,  Disp: , Rfl:    fluorouracil (EFUDEX) 5 % cream, Apply topically at bedtime., Disp: , Rfl:    metoprolol succinate (TOPROL XL) 25 MG 24 hr tablet, Take 1 tablet (25 mg total) by mouth at bedtime., Disp: 30 tablet, Rfl: 2   Multiple Vitamin (MULTI-VITAMIN DAILY PO), Take by mouth., Disp: , Rfl:   Laboratory examination:   Lab Results  Component Value Date   NA 141 07/18/2022   K 3.7 07/18/2022   CO2 28 07/18/2022   GLUCOSE 100 (H) 07/18/2022   BUN 10 07/18/2022   CREATININE 0.67 07/18/2022   CALCIUM 9.3 07/18/2022   GFRNONAA >60 07/18/2022       Latest Ref Rng & Units 07/18/2022    5:40 PM 07/18/2022   12:00 PM  CMP  Glucose 70 - 99 mg/dL 716    BUN 8 - 23 mg/dL 10    Creatinine 9.67 - 1.00 mg/dL 8.93    Sodium 810 - 175 mmol/L 141  SPECIMEN HEMOLYZED. HEMOLYSIS MAY AFFECT INTEGRITY OF RESULTS.   Potassium 3.5 - 5.1 mmol/L 3.7    Chloride 98 - 111 mmol/L 103    CO2 22 - 32 mmol/L 28    Calcium 8.9 - 10.3 mg/dL 9.3        Latest Ref Rng & Units 07/18/2022   12:00 PM  CBC  WBC 4.0 - 10.5 K/uL 4.1   Hemoglobin 12.0 - 15.0 g/dL 10.2   Hematocrit 58.5 - 46.0 % 43.2   Platelets 150 - 400 K/uL 191     Lipid Panel No results for input(s): "CHOL", "TRIG", "LDLCALC", "VLDL", "HDL", "CHOLHDL", "LDLDIRECT" in the last 8760 hours.  HEMOGLOBIN A1C No results found for: "HGBA1C", "MPG" TSH No results for input(s): "TSH" in the last 8760 hours.  External labs:     Radiology:    Cardiac Studies:   Echocardiogram 09/05/2022:  Normal LV systolic function with visual EF 60-65%. Left ventricle cavity is normal in size. Normal left ventricular wall thickness. Normal global wall motion.  Normal diastolic filling pattern, normal LAP. Calculated EF 66%.  Structurally normal tricuspid valve.  Mild tricuspid regurgitation.  No evidence of pulmonary hypertension.  no prior available for comparison.   Exercise nuclear stress test 08/22/22 Myocardial perfusion is normal. Low risk  study. Overall LV systolic function is normal without regional wall motion abnormalities. Stress LV EF: 60%.  Normal  ECG stress. The patient exercised for 5 minutes and 3 seconds of a Bruce protocol, achieving approximately 7.05 METs and reached 100% MPHR. The heart rate response was normal. The blood pressure response was normal. No previous exam available for comparison.   EKG:   08/03/22 EKG - normal sinus rhythm  Assessment     ICD-10-CM   1. Primary hypertension  I10     2. Hyperlipidemia LDL goal <70  E78.5     3. Palpitations  R00.2        No orders of the defined types were placed in this encounter.   No orders of the defined types were placed in this encounter.   There are no discontinued medications.    Recommendations:   NIYA BEHLER is a 66 y.o.  F with  palpitations  Continue current cardiac medications. Patient is tolerating 2.5mg  amlodipine and 25mg  toprolXL. BP is very well controlled at today's visit Continue lipitor and zetia for BP control Encourage low-sodium diet, less than 2000 mg daily. Discussed results of echo and stress test. Follow-up in 12 months or sooner if needed.     Floydene Flock, DO  09/18/2022, 9:41 AM Office: [phone] Pager: [phone]

## 2022-10-29 ENCOUNTER — Other Ambulatory Visit: Payer: Self-pay

## 2022-10-29 MED ORDER — AMLODIPINE BESYLATE 2.5 MG PO TABS: 2.5000 mg | ORAL_TABLET | Freq: Every day | ORAL | 2 refills | Status: DC

## 2022-10-29 MED ORDER — METOPROLOL SUCCINATE ER 25 MG PO TB24: 25.0000 mg | ORAL_TABLET | Freq: Every day | ORAL | 2 refills | Status: DC

## 2022-11-16 IMAGING — MG MM DIGITAL SCREENING BILAT W/ TOMO AND CAD
8 series · 8 of 24 positions shown · non-contrast
Comparison: Previous exam(s).

CLINICAL DATA: Screening.

EXAM:
DIGITAL SCREENING BILATERAL MAMMOGRAM WITH TOMOSYNTHESIS AND CAD
TECHNIQUE: Bilateral screening digital craniocaudal and mediolateral oblique
mammograms were obtained. Bilateral screening digital breast
tomosynthesis was performed. The images were evaluated with
computer-aided detection.

[L MLO synth-2D]
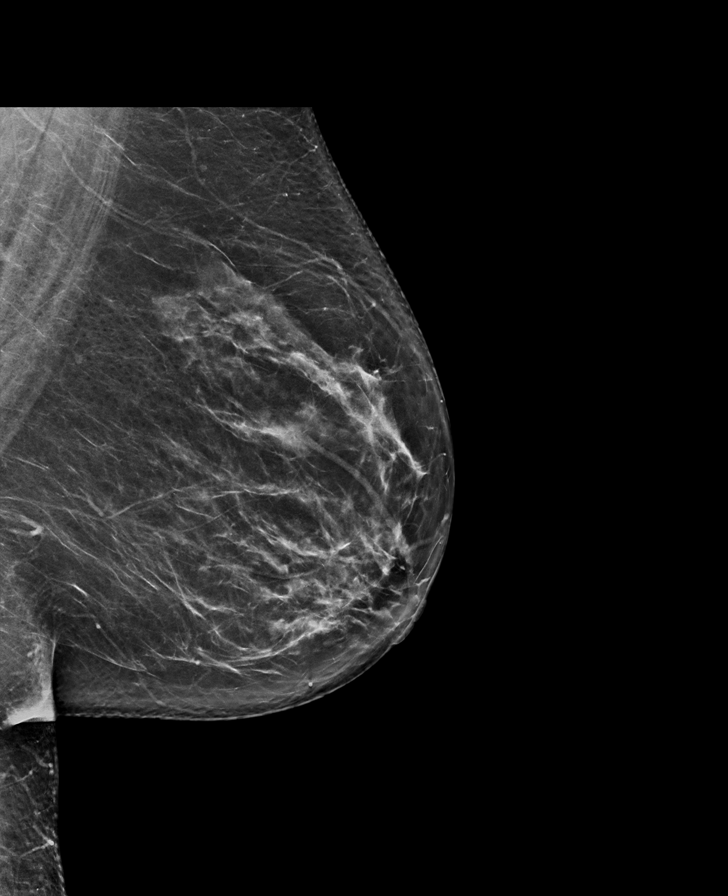

[R CC synth-2D]
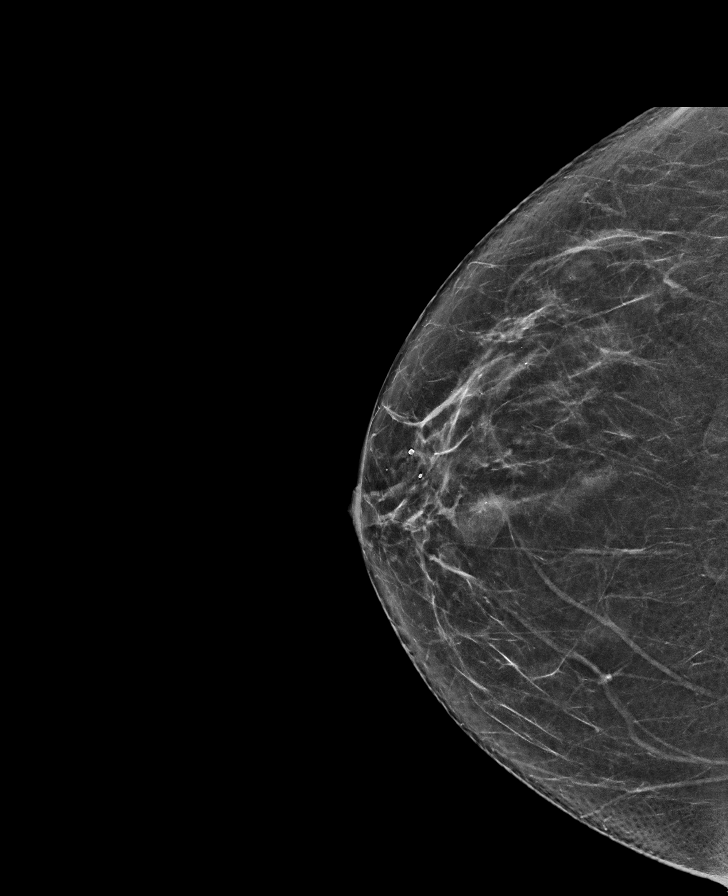

[L CC synth-2D]
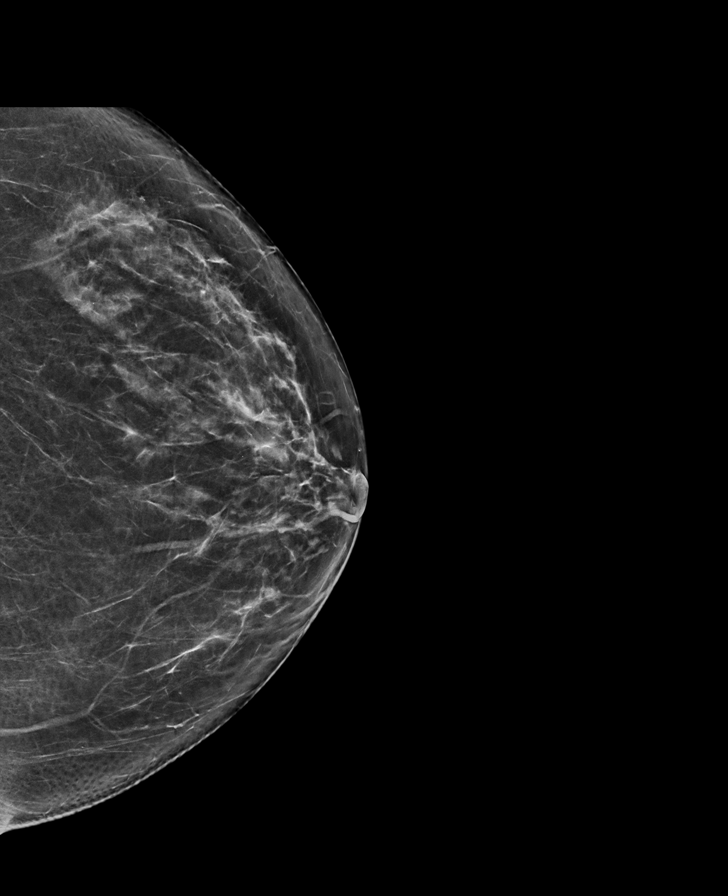

[R MLO synth-2D]
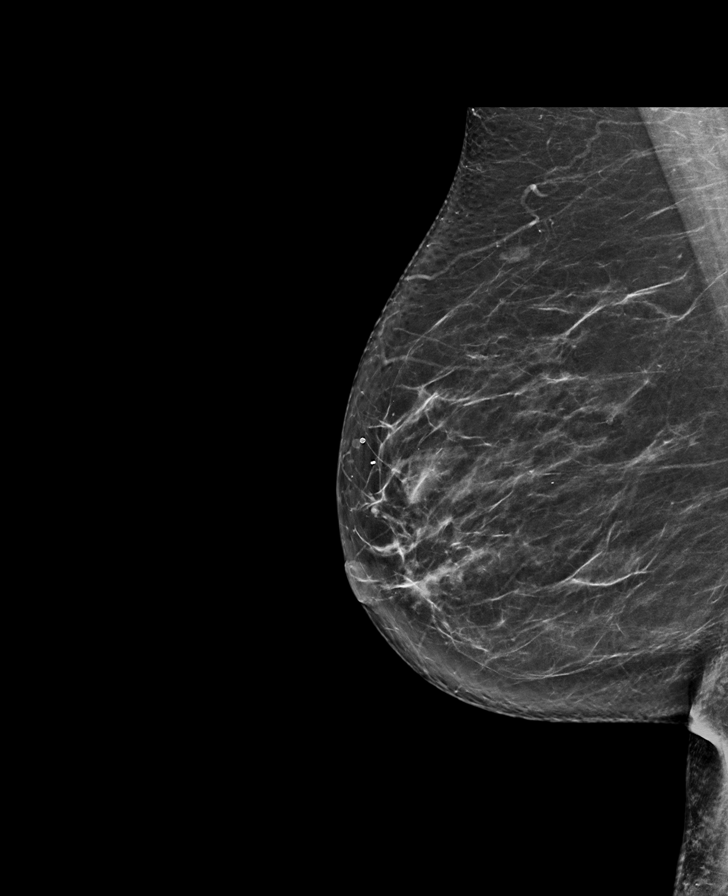

[R CC tomo · tomo slice 37/72.0]
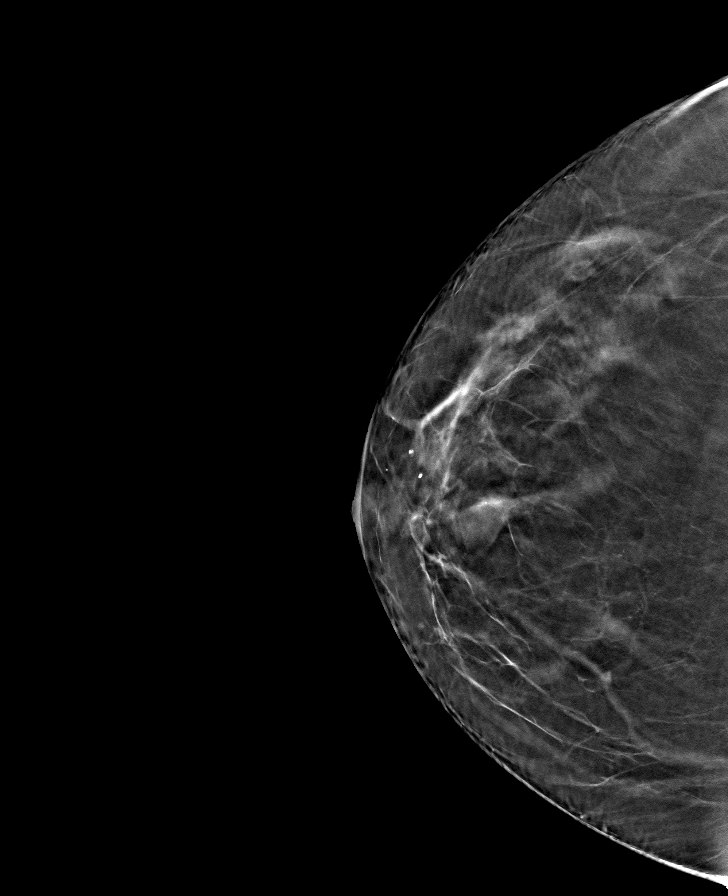

[R MLO tomo · tomo slice 39/78.0]
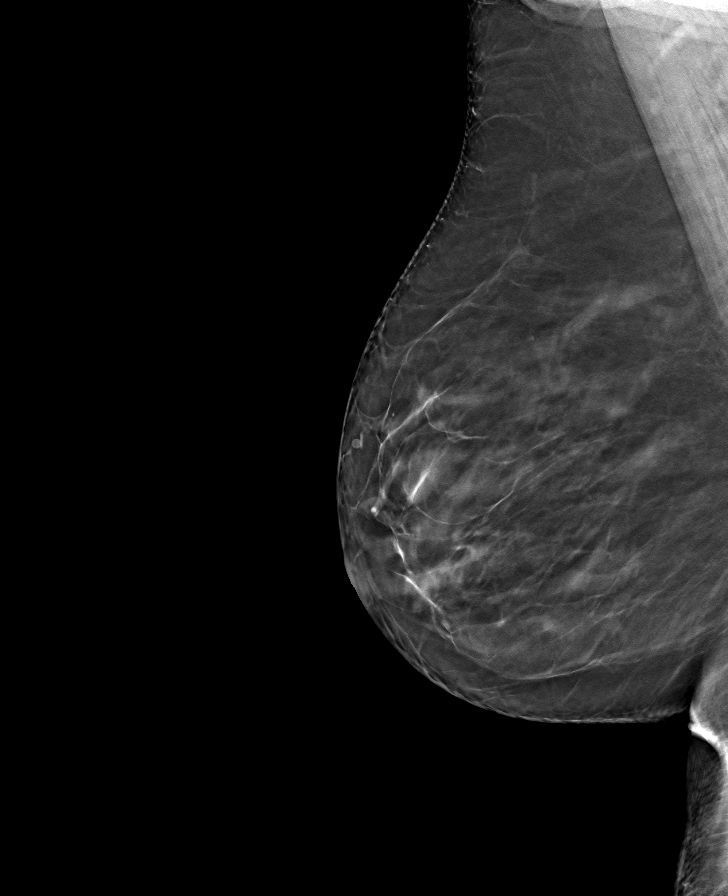

[L MLO tomo · tomo slice 41/82.0]
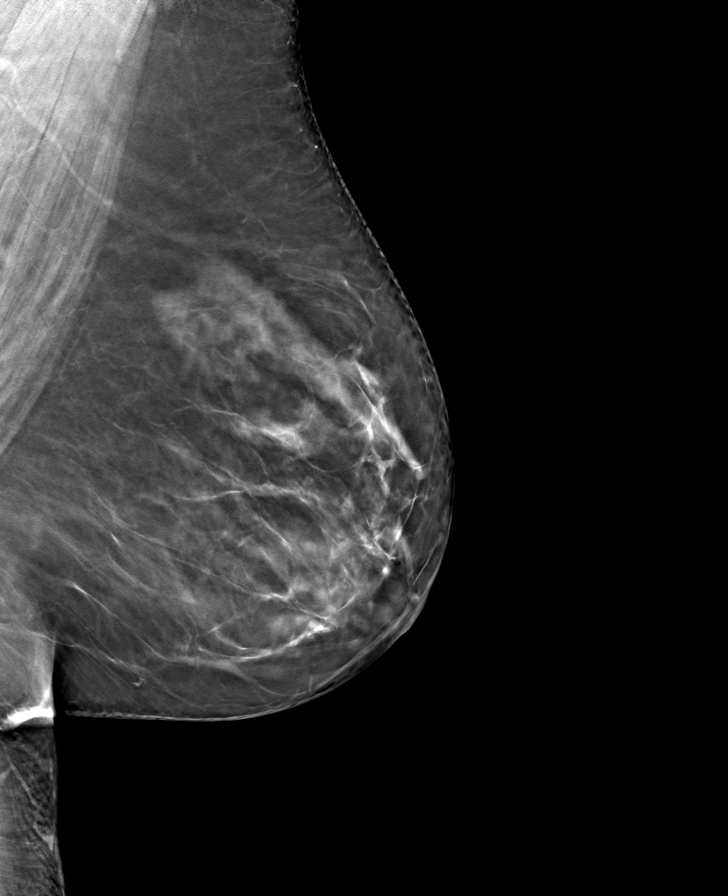

[L CC tomo · tomo slice 39/77.0]
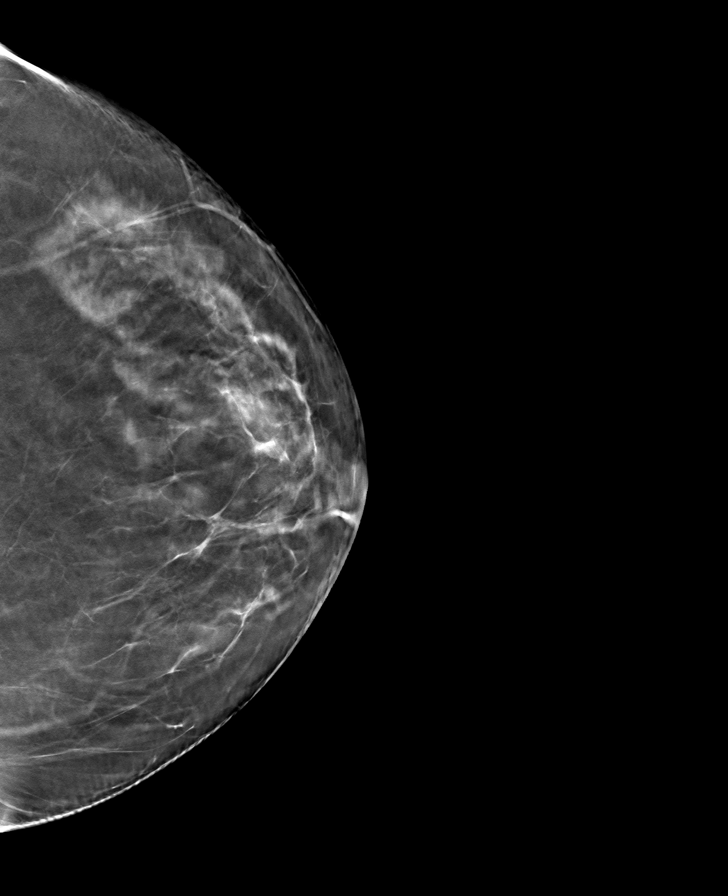

[8 of 24 positions shown; findings below may reference images not displayed]

ACR Breast Density Category b: There are scattered areas of
fibroglandular density.
FINDINGS: There are no findings suspicious for malignancy.
IMPRESSION: No mammographic evidence of malignancy. A result letter of this
screening mammogram will be mailed directly to the patient.

RECOMMENDATION:
Screening mammogram in one year. (Code:51-O-LD2)

BI-RADS CATEGORY  1: Negative.

## 2023-02-14 ENCOUNTER — Other Ambulatory Visit: Payer: Self-pay | Admitting: Internal Medicine

## 2023-02-14 DIAGNOSIS — Z1231 Encounter for screening mammogram for malignant neoplasm of breast: Secondary | ICD-10-CM

## 2023-03-28 ENCOUNTER — Ambulatory Visit
Admission: RE | Admit: 2023-03-28 | Discharge: 2023-03-28 | Disposition: A | Discharge status: Home / Self Care | Payer: Medicare PPO | Source: Ambulatory Visit | Attending: Internal Medicine | Admitting: Internal Medicine

## 2023-03-28 DIAGNOSIS — Z1231 Encounter for screening mammogram for malignant neoplasm of breast: Secondary | ICD-10-CM

## 2023-07-19 ENCOUNTER — Other Ambulatory Visit: Payer: Self-pay | Admitting: Internal Medicine

## 2023-09-19 ENCOUNTER — Ambulatory Visit: Payer: Medicare PPO | Admitting: Internal Medicine

## 2023-09-20 ENCOUNTER — Ambulatory Visit: Payer: Medicare PPO | Admitting: Cardiology

## 2023-09-23 NOTE — Progress Notes (Unsigned)
  Cardiology Office Note:  .   Date:  09/23/2023  ID:  Stacey Espinoza, DOB [birth year], MRN [id number] PCP: Renford Dills, MD  [facility] Providers Cardiologist:  None { Click to update primary MD,subspecialty MD or APP then REFRESH:1}   Patient Profile: .      PMH Sleep apnea Hypertension Hyperlipidemia Obesity Palpitations  She established with [facility] with Dr. Melton Alar on 08/03/2022 for palpitations and chest pain. These were occurring during exercise and rest, no pattern that she could discern.  She felt palpitations were secondary to anxiety. She had recent echo which revealed normal LVEF 60 to 65%, normal diastolic parameters, mild TR, no additional abnormalities. Stress test completed 08/12/2022 was low risk. At follow-up visit on 09/18/22, she was tolerating amlodipine 2.5 mg daily and Toprol-XL 25 mg daily as well as Lipitor and Zetia for hyperlipidemia.  She was encouraged to follow-up in 12 months.       History of Present Illness: .   Stacey Espinoza is a *** 67 y.o. female ***  ROS: ***       Studies Reviewed: .        *** Risk Assessment/Calculations:   {Does this patient have ATRIAL FIBRILLATION?[phone]} No BP recorded.  {Refresh Note OR Click here to enter BP  :1}***       Physical Exam:   VS:  There were no vitals taken for this visit.   Wt Readings from Last 3 Encounters:  09/18/22 219 lb (99.3 kg)  08/03/22 219 lb (99.3 kg)  07/18/22 218 lb (98.9 kg)    GEN: Well nourished, well developed in no acute distress NECK: No JVD; No carotid bruits CARDIAC: ***RRR, no murmurs, rubs, gallops RESPIRATORY:  Clear to auscultation without rales, wheezing or rhonchi  ABDOMEN: Soft, non-tender, non-distended EXTREMITIES:  No edema; No deformity     ASSESSMENT AND PLAN: .    Palpitations: Hypertension: Hyperlipidemia:    {Are you ordering a CV Procedure (e.g. stress test, cath, DCCV, TEE, etc)?   Press F2        :[id number]}  Dispo:  ***  Signed, Eligha Bridegroom, NP-C

## 2023-09-24 ENCOUNTER — Encounter: Payer: Self-pay | Admitting: Nurse Practitioner

## 2023-09-24 ENCOUNTER — Ambulatory Visit: Payer: Medicare PPO | Attending: Nurse Practitioner | Admitting: Nurse Practitioner

## 2023-09-24 VITALS — BP 110/74 | HR 100 | Ht 61.0 in | Wt 227.0 lb

## 2023-09-24 DIAGNOSIS — I10 Essential (primary) hypertension: Secondary | ICD-10-CM

## 2023-09-24 DIAGNOSIS — R002 Palpitations: Secondary | ICD-10-CM

## 2023-09-24 DIAGNOSIS — E785 Hyperlipidemia, unspecified: Secondary | ICD-10-CM

## 2023-09-24 NOTE — Patient Instructions (Signed)
Medication Instructions:   Your physician recommends that you continue on your current medications as directed. Please refer to the Current Medication list given to you today.   *If you need a refill on your cardiac medications before your next appointment, please call your pharmacy*   Lab Work:  None ordered.  If you have labs (blood work) drawn today and your tests are completely normal, you will receive your results only by: MyChart Message (if you have MyChart) OR A paper copy in the mail If you have any lab test that is abnormal or we need to change your treatment, we will call you to review the results.   Testing/Procedures:  None ordered.   Follow-Up: At [facility], you and your health needs are our priority.  As part of our continuing mission to provide you with exceptional heart care, we have created designated Provider Care Teams.  These Care Teams include your primary Cardiologist (physician) and Advanced Practice Providers (APPs -  Physician Assistants and Nurse Practitioners) who all work together to provide you with the care you need, when you need it.  We recommend signing up for the patient portal called "MyChart".  Sign up information is provided on this After Visit Summary.  MyChart is used to connect with patients for Virtual Visits (Telemedicine).  Patients are able to view lab/test results, encounter notes, upcoming appointments, etc.  Non-urgent messages can be sent to your provider as well.   To learn more about what you can do with MyChart, go to [email or web address].    Your next appointment:   1 year(s)  Provider:   Yates Decamp, MD   Other Instructions  Your physician wants you to follow-up in: 1 year.  You will receive a reminder letter in the mail two months in advance. If you don't receive a letter, please call our office to schedule the follow-up appointment.

## 2023-10-28 DIAGNOSIS — Z860101 Personal history of adenomatous and serrated colon polyps: Secondary | ICD-10-CM | POA: Diagnosis not present

## 2023-10-28 DIAGNOSIS — K219 Gastro-esophageal reflux disease without esophagitis: Secondary | ICD-10-CM | POA: Diagnosis not present

## 2023-12-05 DIAGNOSIS — D123 Benign neoplasm of transverse colon: Secondary | ICD-10-CM | POA: Diagnosis not present

## 2023-12-05 DIAGNOSIS — Z09 Encounter for follow-up examination after completed treatment for conditions other than malignant neoplasm: Secondary | ICD-10-CM | POA: Diagnosis not present

## 2023-12-05 DIAGNOSIS — Z860100 Personal history of colon polyps, unspecified: Secondary | ICD-10-CM | POA: Diagnosis not present

## 2023-12-05 DIAGNOSIS — K5730 Diverticulosis of large intestine without perforation or abscess without bleeding: Secondary | ICD-10-CM | POA: Diagnosis not present

## 2023-12-05 DIAGNOSIS — K648 Other hemorrhoids: Secondary | ICD-10-CM | POA: Diagnosis not present

## 2023-12-09 DIAGNOSIS — D123 Benign neoplasm of transverse colon: Secondary | ICD-10-CM | POA: Diagnosis not present

## 2024-01-22 DIAGNOSIS — F411 Generalized anxiety disorder: Secondary | ICD-10-CM | POA: Diagnosis not present

## 2024-02-07 DIAGNOSIS — R509 Fever, unspecified: Secondary | ICD-10-CM | POA: Diagnosis not present

## 2024-02-07 DIAGNOSIS — J101 Influenza due to other identified influenza virus with other respiratory manifestations: Secondary | ICD-10-CM | POA: Diagnosis not present

## 2024-02-26 DIAGNOSIS — F411 Generalized anxiety disorder: Secondary | ICD-10-CM | POA: Diagnosis not present

## 2024-03-02 ENCOUNTER — Other Ambulatory Visit: Payer: Self-pay | Admitting: Internal Medicine

## 2024-03-02 DIAGNOSIS — Z1231 Encounter for screening mammogram for malignant neoplasm of breast: Secondary | ICD-10-CM

## 2024-03-04 DIAGNOSIS — L820 Inflamed seborrheic keratosis: Secondary | ICD-10-CM | POA: Diagnosis not present

## 2024-03-04 DIAGNOSIS — L2989 Other pruritus: Secondary | ICD-10-CM | POA: Diagnosis not present

## 2024-03-04 DIAGNOSIS — L538 Other specified erythematous conditions: Secondary | ICD-10-CM | POA: Diagnosis not present

## 2024-03-11 DIAGNOSIS — F411 Generalized anxiety disorder: Secondary | ICD-10-CM | POA: Diagnosis not present

## 2024-03-30 ENCOUNTER — Ambulatory Visit
Admission: RE | Admit: 2024-03-30 | Discharge: 2024-03-30 | Disposition: A | Discharge status: Home / Self Care | Source: Ambulatory Visit | Attending: Internal Medicine | Admitting: Internal Medicine

## 2024-03-30 DIAGNOSIS — Z1231 Encounter for screening mammogram for malignant neoplasm of breast: Secondary | ICD-10-CM | POA: Diagnosis not present

## 2024-04-09 ENCOUNTER — Other Ambulatory Visit: Payer: Self-pay | Admitting: Cardiology

## 2024-04-15 DIAGNOSIS — F411 Generalized anxiety disorder: Secondary | ICD-10-CM | POA: Diagnosis not present

## 2024-04-23 DIAGNOSIS — L820 Inflamed seborrheic keratosis: Secondary | ICD-10-CM | POA: Diagnosis not present

## 2024-04-23 DIAGNOSIS — L538 Other specified erythematous conditions: Secondary | ICD-10-CM | POA: Diagnosis not present

## 2024-05-27 DIAGNOSIS — F411 Generalized anxiety disorder: Secondary | ICD-10-CM | POA: Diagnosis not present

## 2024-06-10 DIAGNOSIS — F411 Generalized anxiety disorder: Secondary | ICD-10-CM | POA: Diagnosis not present

## 2024-07-01 DIAGNOSIS — H43813 Vitreous degeneration, bilateral: Secondary | ICD-10-CM | POA: Diagnosis not present

## 2024-07-01 DIAGNOSIS — H2513 Age-related nuclear cataract, bilateral: Secondary | ICD-10-CM | POA: Diagnosis not present

## 2024-07-01 DIAGNOSIS — H40013 Open angle with borderline findings, low risk, bilateral: Secondary | ICD-10-CM | POA: Diagnosis not present

## 2024-08-13 DIAGNOSIS — L814 Other melanin hyperpigmentation: Secondary | ICD-10-CM | POA: Diagnosis not present

## 2024-08-13 DIAGNOSIS — L821 Other seborrheic keratosis: Secondary | ICD-10-CM | POA: Diagnosis not present

## 2024-08-13 DIAGNOSIS — L728 Other follicular cysts of the skin and subcutaneous tissue: Secondary | ICD-10-CM | POA: Diagnosis not present

## 2024-08-13 DIAGNOSIS — D225 Melanocytic nevi of trunk: Secondary | ICD-10-CM | POA: Diagnosis not present

## 2024-08-13 DIAGNOSIS — L918 Other hypertrophic disorders of the skin: Secondary | ICD-10-CM | POA: Diagnosis not present

## 2024-10-02 ENCOUNTER — Other Ambulatory Visit: Payer: Self-pay | Admitting: Cardiology

## 2024-10-30 ENCOUNTER — Other Ambulatory Visit: Payer: Self-pay | Admitting: Nurse Practitioner

## 2024-10-31 ENCOUNTER — Other Ambulatory Visit: Payer: Self-pay | Admitting: Nurse Practitioner

## 2024-11-05 DIAGNOSIS — F3342 Major depressive disorder, recurrent, in full remission: Secondary | ICD-10-CM | POA: Diagnosis not present

## 2024-11-05 DIAGNOSIS — G4733 Obstructive sleep apnea (adult) (pediatric): Secondary | ICD-10-CM | POA: Diagnosis not present

## 2024-11-05 DIAGNOSIS — Z5181 Encounter for therapeutic drug level monitoring: Secondary | ICD-10-CM | POA: Diagnosis not present

## 2024-11-05 DIAGNOSIS — E2839 Other primary ovarian failure: Secondary | ICD-10-CM | POA: Diagnosis not present

## 2024-11-05 DIAGNOSIS — Z0000 Encounter for general adult medical examination without abnormal findings: Secondary | ICD-10-CM | POA: Diagnosis not present

## 2024-11-05 DIAGNOSIS — I10 Essential (primary) hypertension: Secondary | ICD-10-CM | POA: Diagnosis not present

## 2024-11-05 DIAGNOSIS — E7800 Pure hypercholesterolemia, unspecified: Secondary | ICD-10-CM | POA: Diagnosis not present

## 2024-11-08 ENCOUNTER — Other Ambulatory Visit: Payer: Self-pay | Admitting: Nurse Practitioner

## 2024-11-11 ENCOUNTER — Telehealth: Payer: Self-pay | Admitting: Cardiology

## 2024-11-11 MED ORDER — METOPROLOL SUCCINATE ER 25 MG PO TB24: 25.0000 mg | ORAL_TABLET | Freq: Every day | ORAL | 1 refills | Status: DC

## 2024-11-11 MED ORDER — AMLODIPINE BESYLATE 2.5 MG PO TABS: 2.5000 mg | ORAL_TABLET | Freq: Every day | ORAL | 1 refills | Status: DC

## 2024-11-11 NOTE — Telephone Encounter (Signed)
*  STAT* If patient is at the pharmacy, call can be transferred to refill team.   1. Which medications need to be refilled? (please list name of each medication and dose if known) amLODipine  (NORVASC ) 2.5 MG tablet    metoprolol  succinate (TOPROL -XL) 25 MG 24 hr tablet    2. Which pharmacy/location (including street and city if local pharmacy) is medication to be sent to?  [id number] [place] [place], [place] - [address]    3. Do they need a 30 day or 90 day supply? 90  Patient has appt on 01/22/25

## 2024-11-11 NOTE — Telephone Encounter (Signed)
 Pt scheduled to see Dr. Ladona 01/22/25.  Refills sent.

## 2024-12-06 ENCOUNTER — Other Ambulatory Visit: Payer: Self-pay | Admitting: Cardiology

## 2025-01-14 ENCOUNTER — Other Ambulatory Visit: Payer: Self-pay | Admitting: Cardiology

## 2025-01-21 NOTE — Progress Notes (Unsigned)
 " Cardiology Office Note:  .   Date:  01/23/2025  ID:  DETRA BORES, DOB 1956-02-07, MRN [id number] PCP: Rexanne Ingle, MD  [facility] Providers Cardiologist:  Gordy Bergamo, MD   History of Present Illness: .   Stacey Espinoza is a 69 y.o. female patient with chronic palpitations, mixed hypercholesterolemia with hypertriglyceridemia, primary hypertension presents for annual follow-up.  Her recent labs on 11/06/2023 revealed A1c at diabetic range.  Echo which revealed normal LVEF 60 to 65%, normal diastolic parameters, mild TR, no additional abnormalities. Stress test completed 08/12/2022 was low risk. She made an appointment to discuss occasional palpitations and Rx refills.     Discussed the use of AI scribe software for clinical note transcription with the patient, who gave verbal consent to proceed.  History of Present Illness Stacey Espinoza is a 69 year old female with hypertension, obesity, and newly diagnosed diabetes who presents for medication management and follow-up.  She is taking atorvastatin 20 mg and ezetimibe 10 mg daily with a recent LDL of 39 mg/dL. Her blood pressure is treated with amlodipine  2.5 mg daily and she states it is well controlled.  She has obesity and prior sleeve gastrectomy and is working on slowing her eating and reducing carbohydrates and sugar to lose weight for health.  She was recently diagnosed with diabetes with an A1c of 6.5% and is focusing on lowering carbohydrate and sugar intake to help control it.  Over the past year she has had three to four panic attacks with palpitations. She takes metoprolol  for palpitations related to these episodes.  Her family history is notable for obesity in both parents and brothers.  Cardiac Studies relevent.    Cardiac Studies & Procedures   ______________________________________________________________________________________________  MYOCARDIAL PERFUSION WO LEXISCAN  08/22/2022 Myocardial perfusion is  normal. Low risk study. Overall LV systolic function is normal without regional wall motion abnormalities. Stress LV EF: 60%. Normal ECG stress. The patient exercised for 5 minutes and 3 seconds of a Bruce protocol, achieving approximately 7.05 METs and reached 100% MPHR. The heart rate response was normal. The blood pressure response was normal. No previous exam available for comparison.   Echocardiogram 09/05/2022: Normal LV systolic function with visual EF 60-65%. Left ventricle cavity is normal in size. Normal left ventricular wall thickness. Normal global wall motion. Normal diastolic filling pattern, normal LAP. Calculated EF 66%. Structurally normal tricuspid valve.  Mild tricuspid regurgitation. No evidence of pulmonary hypertension. no prior available for comparison.  ____________________________________________________________________________________________    EKG:      Labs   Care everywhere/Faxed External Labs:  Labs 11/05/2024:  Total cholesterol 136, triglycerides 145, HDL 35, LDL 39.  Hb 15.3/HCT 43.9, platelets 214.  Serum glucose 106 mg, BUN 16, creatinine 0.66, eGFR 96 mL, potassium 3.8, LFTs normal.  TSH normal at 2.41, A1c 6.5%.  ROS  Review of Systems  Cardiovascular:  Positive for palpitations. Negative for chest pain, dyspnea on exertion and leg swelling.   Physical Exam:   VS:  BP 110/72 (BP Location: Right Arm, Patient Position: Sitting, Cuff Size: Large)   Pulse 82   Ht 5' 1 (1.549 m)   Wt 230 lb (104.3 kg)   SpO2 96%   BMI 43.46 kg/m    Wt Readings from Last 3 Encounters:  01/22/25 230 lb (104.3 kg)  09/24/23 227 lb (103 kg)  09/18/22 219 lb (99.3 kg)    BP Readings from Last 3 Encounters:  01/22/25 110/72  09/24/23 110/74  09/18/22  124/75   Physical Exam Constitutional:      Appearance: She is morbidly obese.  Neck:     Vascular: No carotid bruit or JVD.  Cardiovascular:     Rate and Rhythm: Normal rate and regular rhythm.      Pulses: Intact distal pulses.     Heart sounds: Normal heart sounds. No murmur heard.    No gallop.  Pulmonary:     Effort: Pulmonary effort is normal.     Breath sounds: Normal breath sounds.  Abdominal:     General: Bowel sounds are normal.     Palpations: Abdomen is soft.  Musculoskeletal:     Right lower leg: No edema.     Left lower leg: No edema.     ASSESSMENT AND PLAN: .      ICD-10-CM   1. Palpitations  R00.2 metoprolol  succinate (TOPROL -XL) 25 MG 24 hr tablet    2. Primary hypertension  I10 amLODipine  (NORVASC ) 2.5 MG tablet    3. Mixed hyperglyceridemia  E78.3     4. Type 2 diabetes mellitus without complication, without long-term current use of insulin (HCC)  E11.9     5. Class 3 severe obesity due to excess calories with serious comorbidity and body mass index (BMI) of 40.0 to 44.9 in adult [facility])  Z33.186    Z68.41      Assessment & Plan Class 3 obesity with serious comorbidity Class 3 obesity with serious comorbidity. Discussed potential benefits of GLP-1 agonists for weight loss, emphasizing the importance of slow eating to avoid gastrointestinal side effects. Highlighted the medical nature of obesity and the role of genetic factors. Encouraged commitment to lifestyle changes for long-term health benefits. - Make an appointment with primary care physician to discuss GLP-1 agonist therapy. - Practice slow eating techniques to minimize gastrointestinal side effects.  Type 2 diabetes mellitus New onset type 2 diabetes mellitus with an A1c of 6.5%. Discussed the importance of carbohydrate and sugar reduction in diet. Consideration of GLP-1 agonists for weight loss and potential diabetes management benefits. - Continue dietary modifications to reduce carbohydrate and sugar intake. - Discuss GLP-1 agonist therapy with primary care physician.  Primary hypertension Well controlled with amlodipine  2.5 mg. Discussed potential for blood pressure reduction with weight  loss, which may allow for discontinuation of antihypertensive medication. - Continue amlodipine  2.5 mg daily. - Monitor blood pressure and consider medication adjustment with weight loss. - She made an appointment to discuss occasional palpitations and Rx refills.   Palpitations and panic attacks Palpitations and panic attacks, previously managed with metoprolol . Discussed potential switch to propranolol for better management of panic attacks. Current management includes meditation and other coping strategies. - Will consider switching to propranolol for better management of panic attacks. - Continue meditation and coping strategies for panic attack management.  Mixed hyperlipidemia Well controlled with atorvastatin 20 mg and ezetimibe 10 mg. Recent labs show LDL at 39, indicating good control. - Continue atorvastatin 20 mg daily.  Follow up: PRN. Med Refills with PCP  Signed,  Gordy Bergamo, MD, [facility] 01/23/2025, 9:05 AM [facility] [address] [place], [place] [address] Phone: [phone]. Fax:  [phone]  "

## 2025-01-22 ENCOUNTER — Encounter: Payer: Self-pay | Admitting: Cardiology

## 2025-01-22 ENCOUNTER — Ambulatory Visit: Attending: Cardiology | Admitting: Cardiology

## 2025-01-22 VITALS — BP 110/72 | HR 82 | Ht 61.0 in | Wt 230.0 lb

## 2025-01-22 DIAGNOSIS — E783 Hyperchylomicronemia: Secondary | ICD-10-CM | POA: Diagnosis not present

## 2025-01-22 DIAGNOSIS — I10 Essential (primary) hypertension: Secondary | ICD-10-CM

## 2025-01-22 DIAGNOSIS — R002 Palpitations: Secondary | ICD-10-CM | POA: Diagnosis not present

## 2025-01-22 DIAGNOSIS — E119 Type 2 diabetes mellitus without complications: Secondary | ICD-10-CM | POA: Diagnosis not present

## 2025-01-22 DIAGNOSIS — Z6841 Body Mass Index (BMI) 40.0 and over, adult: Secondary | ICD-10-CM | POA: Diagnosis not present

## 2025-01-22 DIAGNOSIS — E66813 Obesity, class 3: Secondary | ICD-10-CM | POA: Diagnosis not present

## 2025-01-22 MED ORDER — AMLODIPINE BESYLATE 2.5 MG PO TABS: 2.5000 mg | ORAL_TABLET | Freq: Every day | ORAL | 0 refills | Status: AC

## 2025-01-22 MED ORDER — METOPROLOL SUCCINATE ER 25 MG PO TB24: 25.0000 mg | ORAL_TABLET | Freq: Every day | ORAL | 0 refills | Status: AC

## 2025-01-22 NOTE — Patient Instructions (Signed)
 Medication Instructions:  Your physician recommends that you continue on your current medications as directed. Please refer to the Current Medication list given to you today.   *If you need a refill on your cardiac medications before your next appointment, please call your pharmacy*   Follow-Up: At [facility], you and your health needs are our priority.  As part of our continuing mission to provide you with exceptional heart care, our providers are all part of one team.  This team includes your primary Cardiologist (physician) and Advanced Practice Providers or APPs (Physician Assistants and Nurse Practitioners) who all work together to provide you with the care you need, when you need it.  Your next appointment:    As Needed   Provider:   Gordy Bergamo, MD       We recommend signing up for the patient portal called MyChart.  Patients are able to view lab/test results, encounter notes, upcoming appointments, etc.  Non-urgent messages can be sent to your provider as well, go to [email or web address].
